# Patient Record
Sex: Male | Born: 1955 | Race: White | Hispanic: No | Marital: Married | Smoking: Former smoker
Health system: Southern US, Community
[De-identification: ages and names within clinical notes are randomized; demographics above are authoritative.]

## PROBLEM LIST (undated history)

## (undated) DIAGNOSIS — N320 Bladder-neck obstruction: Secondary | ICD-10-CM

## (undated) DIAGNOSIS — G709 Myoneural disorder, unspecified: Secondary | ICD-10-CM

## (undated) DIAGNOSIS — N138 Other obstructive and reflux uropathy: Secondary | ICD-10-CM

## (undated) DIAGNOSIS — Z973 Presence of spectacles and contact lenses: Secondary | ICD-10-CM

## (undated) DIAGNOSIS — R399 Unspecified symptoms and signs involving the genitourinary system: Secondary | ICD-10-CM

## (undated) DIAGNOSIS — D649 Anemia, unspecified: Secondary | ICD-10-CM

## (undated) DIAGNOSIS — M722 Plantar fascial fibromatosis: Secondary | ICD-10-CM

## (undated) DIAGNOSIS — E119 Type 2 diabetes mellitus without complications: Secondary | ICD-10-CM

## (undated) DIAGNOSIS — K219 Gastro-esophageal reflux disease without esophagitis: Secondary | ICD-10-CM

## (undated) DIAGNOSIS — E785 Hyperlipidemia, unspecified: Secondary | ICD-10-CM

## (undated) DIAGNOSIS — R972 Elevated prostate specific antigen [PSA]: Secondary | ICD-10-CM

## (undated) DIAGNOSIS — M7662 Achilles tendinitis, left leg: Secondary | ICD-10-CM

## (undated) DIAGNOSIS — I10 Essential (primary) hypertension: Secondary | ICD-10-CM

## (undated) DIAGNOSIS — N400 Enlarged prostate without lower urinary tract symptoms: Secondary | ICD-10-CM

## (undated) HISTORY — PX: TONSILLECTOMY: SUR1361

## (undated) HISTORY — PX: COLONOSCOPY: SHX174

## (undated) HISTORY — PX: BONE MARROW HARVEST: SHX896

## (undated) HISTORY — PX: PROSTATE BIOPSY: SHX241

## (undated) HISTORY — PX: OTHER SURGICAL HISTORY: SHX169

---

## 2013-09-29 ENCOUNTER — Other Ambulatory Visit (HOSPITAL_COMMUNITY): Payer: Self-pay | Admitting: Urology

## 2013-09-29 DIAGNOSIS — R972 Elevated prostate specific antigen [PSA]: Secondary | ICD-10-CM

## 2013-11-03 ENCOUNTER — Ambulatory Visit (HOSPITAL_COMMUNITY)
Admission: RE | Admit: 2013-11-03 | Discharge: 2013-11-03 | Disposition: A | Discharge status: Home / Self Care | Payer: PRIVATE HEALTH INSURANCE | Source: Ambulatory Visit | Attending: Urology | Admitting: Urology

## 2013-11-03 DIAGNOSIS — R9389 Abnormal findings on diagnostic imaging of other specified body structures: Secondary | ICD-10-CM | POA: Insufficient documentation

## 2013-11-03 DIAGNOSIS — N400 Enlarged prostate without lower urinary tract symptoms: Secondary | ICD-10-CM | POA: Insufficient documentation

## 2013-11-03 DIAGNOSIS — R972 Elevated prostate specific antigen [PSA]: Secondary | ICD-10-CM | POA: Insufficient documentation

## 2013-11-03 LAB — POCT I-STAT, CHEM 8
Calcium, Ion: 1.26 mmol/L — ABNORMAL HIGH (ref 1.12–1.23)
Creatinine, Ser: 1.2 mg/dL (ref 0.50–1.35)
Glucose, Bld: 126 mg/dL — ABNORMAL HIGH (ref 70–99)
Hemoglobin: 16.3 g/dL (ref 13.0–17.0)
Potassium: 4.3 mEq/L (ref 3.5–5.1)
Sodium: 139 mEq/L (ref 135–145)

## 2013-11-03 MED ORDER — GADOBENATE DIMEGLUMINE 529 MG/ML IV SOLN
20.0000 mL | Freq: Once | INTRAVENOUS | Status: AC | PRN
  Administered 2013-11-03: 20 mL via INTRAVENOUS

## 2016-04-02 DIAGNOSIS — E119 Type 2 diabetes mellitus without complications: Secondary | ICD-10-CM | POA: Insufficient documentation

## 2016-04-02 DIAGNOSIS — E785 Hyperlipidemia, unspecified: Secondary | ICD-10-CM | POA: Insufficient documentation

## 2016-04-02 DIAGNOSIS — I10 Essential (primary) hypertension: Secondary | ICD-10-CM | POA: Insufficient documentation

## 2016-04-16 ENCOUNTER — Ambulatory Visit: Payer: Self-pay

## 2016-04-16 ENCOUNTER — Ambulatory Visit (INDEPENDENT_AMBULATORY_CARE_PROVIDER_SITE_OTHER): Payer: PRIVATE HEALTH INSURANCE | Admitting: Podiatry

## 2016-04-16 ENCOUNTER — Encounter: Payer: Self-pay | Admitting: Podiatry

## 2016-04-16 VITALS — BP 137/70 | HR 82 | Resp 16

## 2016-04-16 DIAGNOSIS — M722 Plantar fascial fibromatosis: Secondary | ICD-10-CM | POA: Diagnosis not present

## 2016-04-16 DIAGNOSIS — M7662 Achilles tendinitis, left leg: Secondary | ICD-10-CM

## 2016-04-16 DIAGNOSIS — M79672 Pain in left foot: Secondary | ICD-10-CM

## 2016-04-16 MED ORDER — MELOXICAM 15 MG PO TABS: 15.0000 mg | ORAL_TABLET | Freq: Every day | ORAL | Status: DC

## 2016-04-16 NOTE — Addendum Note (Signed)
Addended by: Lottie RaterPREVETTE, ASHLEY E on: 04/16/2016 01:50 PM   Modules accepted: Orders

## 2016-04-16 NOTE — Progress Notes (Signed)
   Subjective:    Patient ID: Timothy Shepherd, male    DOB: January 11, 1956, 60 y.o.   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 He states this seems to be getting worse as of late causing him to present to his PCP for an x-ray was deemed to be negative. He states that he continues to take ibuprofen when necessary between 400-800 mg. He states that he gets some relief with this but not complete. He is a type II diabetic with a hemoglobin A1c of 7.6. He denies numbness and tingling other than occasional. He does have an area of numbness to his anterolateral right thigh.    Review of Systems  Constitutional: Positive for fatigue.  HENT: Positive for sinus pressure and sneezing.   Eyes: Positive for redness and itching.  Gastrointestinal: Positive for diarrhea.  Endocrine: Positive for polydipsia and polyuria.  Genitourinary: Positive for urgency, frequency and difficulty urinating.  Allergic/Immunologic: Positive for environmental allergies.  Neurological: Positive for numbness.  Psychiatric/Behavioral: The patient is nervous/anxious.   All other systems reviewed and are negative.      Objective:   Physical Exam: Vital signs are stable alert and oriented 3 pulses are strongly palpable. Neurologic sensorium is intact deep tendon reflexes are deferred muscle strength is normal. Orthopedic evaluation his result was distal to the ankle for range of motion without crepitation. Minimal reproducible pain on palpation at the Achilles tendon insertion site left. However there is significant pain on palpation of the left heel. This pain is palpated at the plantar fascia calcaneal insertion site which is consistent with plantar fasciitis. Cutaneous evaluation does not demonstrate any type of open wounds or lesions. Radiographs were not taken today at his request nor did he bring a disc for us to review.          Assessment & Plan:  Assessment: Probable plantar fasciitis left foot with compensatory insertional Achilles tendinitis left foot.  Plan: Discussed etiology pathology conservative versus surgical therapies. I injected his left heel today plantarly with Kenalog and local anesthetic a total of 20 mg was utilized. Placed him in a plantar fascia brace and a night splint. I also wrote a prescription for mobile. Did not write a prescription for a steroid secondary to diabetes. I will follow up with him in 1 month. We did discuss etiology pathology conservative versus surgical therapies. We also discussed appropriate shoe gear stretching exercises ice therapy and shoe modifications.

## 2016-05-19 ENCOUNTER — Ambulatory Visit (INDEPENDENT_AMBULATORY_CARE_PROVIDER_SITE_OTHER): Payer: PRIVATE HEALTH INSURANCE | Admitting: Podiatry

## 2016-05-19 ENCOUNTER — Encounter: Payer: Self-pay | Admitting: Podiatry

## 2016-05-19 DIAGNOSIS — M7662 Achilles tendinitis, left leg: Secondary | ICD-10-CM

## 2016-05-19 DIAGNOSIS — M722 Plantar fascial fibromatosis: Secondary | ICD-10-CM | POA: Diagnosis not present

## 2016-05-19 NOTE — Progress Notes (Signed)
He presents today for follow-up of his plantar fasciitis to his left heel. He states that he is approximately 80% improved. He is able to continue his anti-inflammatories his night splint and plantar fascia brace.  Objective: Vital signs are stable he is alert and oriented 3. Minimal reproduction of pain on palpation medially continue tubercle of the left heel.  Assessment: Well-healing plantar fasciitis and 80% left.  Plan: Encouraged him to continue all conservative therapies until he is 100% +1 extra month.

## 2016-06-03 ENCOUNTER — Other Ambulatory Visit: Payer: Self-pay | Admitting: Urology

## 2016-06-04 ENCOUNTER — Other Ambulatory Visit (HOSPITAL_COMMUNITY): Payer: Self-pay | Admitting: Urology

## 2016-06-04 DIAGNOSIS — R972 Elevated prostate specific antigen [PSA]: Secondary | ICD-10-CM

## 2016-06-05 ENCOUNTER — Other Ambulatory Visit: Payer: Self-pay | Admitting: Urology

## 2016-06-08 ENCOUNTER — Other Ambulatory Visit: Payer: Self-pay | Admitting: Urology

## 2016-06-09 ENCOUNTER — Ambulatory Visit (INDEPENDENT_AMBULATORY_CARE_PROVIDER_SITE_OTHER): Payer: PRIVATE HEALTH INSURANCE | Admitting: Podiatry

## 2016-06-09 ENCOUNTER — Other Ambulatory Visit: Payer: Self-pay | Admitting: Urology

## 2016-06-09 ENCOUNTER — Encounter: Payer: Self-pay | Admitting: Podiatry

## 2016-06-09 DIAGNOSIS — M722 Plantar fascial fibromatosis: Secondary | ICD-10-CM | POA: Diagnosis not present

## 2016-06-09 NOTE — Progress Notes (Signed)
He presents today for follow-up of his Achilles tendinitis and plantar fasciitis left foot. He states that he is approximately 85% improved the majority of his soreness is in the plantar aspect of the heel with apparent resolution of the Achilles tendinitis. His daughter is to be married this weekend.  Objective: Vital signs are stable alert and oriented 3. Pulses are palpable. Neurologic sensorium is intact. Deep tendon reflexes are intact. No pain on palpation to the Achilles posterior aspect of the left heel. Considerable discomfort on palpation of the medial calcaneal tubercle of the left heel.  Assessment: Plantar fasciitis left foot 85% resolved Achilles tendinitis  Plan: I reinjected his left heel with Kenalog and local anesthetic today he will continue all conservative therapies I also have him scanned for set of orthotics.

## 2016-06-12 ENCOUNTER — Other Ambulatory Visit: Payer: Self-pay | Admitting: Urology

## 2016-06-22 ENCOUNTER — Other Ambulatory Visit: Payer: Self-pay | Admitting: Urology

## 2016-07-01 ENCOUNTER — Encounter (HOSPITAL_BASED_OUTPATIENT_CLINIC_OR_DEPARTMENT_OTHER): Payer: Self-pay | Admitting: *Deleted

## 2016-07-01 NOTE — Progress Notes (Signed)
NPO AFTER MN.  ARRIVE AT [id number].  NEEDS ISTAT 8 AND EKG.  WILL TAKE UROXATRAL AM DOS W/ SIPS OF WATER.

## 2016-07-07 ENCOUNTER — Ambulatory Visit: Payer: PRIVATE HEALTH INSURANCE | Admitting: Podiatry

## 2016-07-07 ENCOUNTER — Other Ambulatory Visit: Payer: Self-pay | Admitting: Radiology

## 2016-07-08 ENCOUNTER — Encounter (HOSPITAL_BASED_OUTPATIENT_CLINIC_OR_DEPARTMENT_OTHER)
Admission: RE | Disposition: A | Discharge status: Home / Self Care | Payer: Self-pay | Source: Ambulatory Visit | Attending: Urology

## 2016-07-08 ENCOUNTER — Ambulatory Visit (HOSPITAL_BASED_OUTPATIENT_CLINIC_OR_DEPARTMENT_OTHER): Payer: PRIVATE HEALTH INSURANCE | Admitting: Anesthesiology

## 2016-07-08 ENCOUNTER — Ambulatory Visit (HOSPITAL_BASED_OUTPATIENT_CLINIC_OR_DEPARTMENT_OTHER)
Admission: RE | Admit: 2016-07-08 | Discharge: 2016-07-08 | Disposition: A | Discharge status: Home / Self Care | Payer: PRIVATE HEALTH INSURANCE | Source: Ambulatory Visit | Attending: Urology | Admitting: Urology

## 2016-07-08 ENCOUNTER — Ambulatory Visit (HOSPITAL_COMMUNITY)
Admission: RE | Admit: 2016-07-08 | Discharge: 2016-07-08 | Disposition: A | Discharge status: Home / Self Care | Payer: PRIVATE HEALTH INSURANCE | Source: Ambulatory Visit | Attending: Urology | Admitting: Urology

## 2016-07-08 ENCOUNTER — Encounter (HOSPITAL_BASED_OUTPATIENT_CLINIC_OR_DEPARTMENT_OTHER): Payer: Self-pay | Admitting: Anesthesiology

## 2016-07-08 DIAGNOSIS — E119 Type 2 diabetes mellitus without complications: Secondary | ICD-10-CM | POA: Insufficient documentation

## 2016-07-08 DIAGNOSIS — Z87891 Personal history of nicotine dependence: Secondary | ICD-10-CM | POA: Diagnosis not present

## 2016-07-08 DIAGNOSIS — Z7984 Long term (current) use of oral hypoglycemic drugs: Secondary | ICD-10-CM | POA: Insufficient documentation

## 2016-07-08 DIAGNOSIS — E785 Hyperlipidemia, unspecified: Secondary | ICD-10-CM | POA: Insufficient documentation

## 2016-07-08 DIAGNOSIS — N401 Enlarged prostate with lower urinary tract symptoms: Secondary | ICD-10-CM | POA: Diagnosis present

## 2016-07-08 DIAGNOSIS — N529 Male erectile dysfunction, unspecified: Secondary | ICD-10-CM | POA: Insufficient documentation

## 2016-07-08 DIAGNOSIS — Z7982 Long term (current) use of aspirin: Secondary | ICD-10-CM | POA: Insufficient documentation

## 2016-07-08 DIAGNOSIS — K210 Gastro-esophageal reflux disease with esophagitis: Secondary | ICD-10-CM | POA: Diagnosis not present

## 2016-07-08 DIAGNOSIS — I10 Essential (primary) hypertension: Secondary | ICD-10-CM | POA: Diagnosis not present

## 2016-07-08 DIAGNOSIS — R972 Elevated prostate specific antigen [PSA]: Secondary | ICD-10-CM

## 2016-07-08 DIAGNOSIS — Z79899 Other long term (current) drug therapy: Secondary | ICD-10-CM | POA: Insufficient documentation

## 2016-07-08 DIAGNOSIS — N138 Other obstructive and reflux uropathy: Secondary | ICD-10-CM | POA: Diagnosis not present

## 2016-07-08 DIAGNOSIS — Z791 Long term (current) use of non-steroidal anti-inflammatories (NSAID): Secondary | ICD-10-CM | POA: Insufficient documentation

## 2016-07-08 HISTORY — PX: CYSTOSCOPY WITH BIOPSY: SHX5122

## 2016-07-08 HISTORY — DX: Gastro-esophageal reflux disease without esophagitis: K21.9

## 2016-07-08 HISTORY — DX: Plantar fascial fibromatosis: M72.2

## 2016-07-08 HISTORY — DX: Achilles tendinitis, left leg: M76.62

## 2016-07-08 HISTORY — DX: Elevated prostate specific antigen (PSA): R97.20

## 2016-07-08 HISTORY — DX: Type 2 diabetes mellitus without complications: E11.9

## 2016-07-08 HISTORY — DX: Unspecified symptoms and signs involving the genitourinary system: R39.9

## 2016-07-08 HISTORY — DX: Hyperlipidemia, unspecified: E78.5

## 2016-07-08 HISTORY — DX: Essential (primary) hypertension: I10

## 2016-07-08 HISTORY — DX: Anemia, unspecified: D64.9

## 2016-07-08 HISTORY — DX: Presence of spectacles and contact lenses: Z97.3

## 2016-07-08 HISTORY — DX: Benign prostatic hyperplasia without lower urinary tract symptoms: N40.0

## 2016-07-08 HISTORY — DX: Bladder-neck obstruction: N32.0

## 2016-07-08 LAB — POCT I-STAT, CHEM 8
BUN: 13 mg/dL (ref 6–20)
CALCIUM ION: 1.3 mmol/L — AB (ref 1.12–1.23)
CREATININE: 0.8 mg/dL (ref 0.61–1.24)
Chloride: 104 mmol/L (ref 101–111)
GLUCOSE: 139 mg/dL — AB (ref 65–99)
HCT: 41 % (ref 39.0–52.0)
HEMOGLOBIN: 13.9 g/dL (ref 13.0–17.0)
Potassium: 4.1 mmol/L (ref 3.5–5.1)
Sodium: 142 mmol/L (ref 135–145)
TCO2: 23 mmol/L (ref 0–100)

## 2016-07-08 SURGERY — CYSTOSCOPY, WITH BIOPSY
Anesthesia: General | Site: Prostate

## 2016-07-08 MED ORDER — MIDAZOLAM HCL 2 MG/ML PO SYRP
10.0000 mg | ORAL_SOLUTION | Freq: Once | ORAL | Status: DC
  Filled 2016-07-08: qty 5

## 2016-07-08 MED ORDER — HYDROCODONE-ACETAMINOPHEN 7.5-325 MG PO TABS
1.0000 | ORAL_TABLET | Freq: Once | ORAL | Status: DC | PRN
  Filled 2016-07-08: qty 1

## 2016-07-08 MED ORDER — FENTANYL CITRATE (PF) 100 MCG/2ML IJ SOLN
INTRAMUSCULAR | Status: AC
  Filled 2016-07-08: qty 2

## 2016-07-08 MED ORDER — FENTANYL CITRATE (PF) 100 MCG/2ML IJ SOLN
INTRAMUSCULAR | Status: DC | PRN
  Administered 2016-07-08: 50 ug via INTRAVENOUS

## 2016-07-08 MED ORDER — PROPOFOL 10 MG/ML IV BOLUS
INTRAVENOUS | Status: AC
  Filled 2016-07-08: qty 20

## 2016-07-08 MED ORDER — GENTAMICIN SULFATE 40 MG/ML IJ SOLN
5.0000 mg/kg | INTRAVENOUS | Status: DC
  Filled 2016-07-08: qty 11

## 2016-07-08 MED ORDER — ONDANSETRON HCL 4 MG/2ML IJ SOLN
INTRAMUSCULAR | Status: DC | PRN
  Administered 2016-07-08: 4 mg via INTRAVENOUS

## 2016-07-08 MED ORDER — LIDOCAINE HCL (CARDIAC) 20 MG/ML IV SOLN
INTRAVENOUS | Status: DC | PRN
  Administered 2016-07-08: 80 mg via INTRAVENOUS

## 2016-07-08 MED ORDER — MIDAZOLAM HCL 5 MG/5ML IJ SOLN
INTRAMUSCULAR | Status: DC | PRN
  Administered 2016-07-08: 2 mg via INTRAVENOUS

## 2016-07-08 MED ORDER — HYDROCODONE-ACETAMINOPHEN 5-325 MG PO TABS
ORAL_TABLET | ORAL | Status: AC
  Filled 2016-07-08: qty 1

## 2016-07-08 MED ORDER — GENTAMICIN SULFATE 40 MG/ML IJ SOLN
5.0000 mg/kg | INTRAVENOUS | Status: AC
  Administered 2016-07-08: 440 mg via INTRAVENOUS
  Filled 2016-07-08 (×2): qty 10.75

## 2016-07-08 MED ORDER — PROPOFOL 10 MG/ML IV BOLUS
INTRAVENOUS | Status: DC | PRN
  Administered 2016-07-08: 200 mg via INTRAVENOUS

## 2016-07-08 MED ORDER — EPHEDRINE SULFATE 50 MG/ML IJ SOLN
INTRAMUSCULAR | Status: DC | PRN
  Administered 2016-07-08: 15 mg via INTRAVENOUS
  Administered 2016-07-08: 10 mg via INTRAVENOUS

## 2016-07-08 MED ORDER — STERILE WATER FOR IRRIGATION IR SOLN
Status: DC | PRN
Start: 2016-07-08 — End: 2016-07-08
  Administered 2016-07-08: 3000 mL

## 2016-07-08 MED ORDER — FENTANYL CITRATE (PF) 100 MCG/2ML IJ SOLN
25.0000 ug | INTRAMUSCULAR | Status: DC | PRN
  Filled 2016-07-08: qty 1

## 2016-07-08 MED ORDER — ONDANSETRON HCL 4 MG/2ML IJ SOLN
INTRAMUSCULAR | Status: AC
Start: 2016-07-08 — End: 2016-07-08
  Filled 2016-07-08: qty 2

## 2016-07-08 MED ORDER — DEXAMETHASONE SODIUM PHOSPHATE 10 MG/ML IJ SOLN
INTRAMUSCULAR | Status: AC
  Filled 2016-07-08: qty 1

## 2016-07-08 MED ORDER — HYDROCODONE-ACETAMINOPHEN 5-325 MG PO TABS
1.0000 | ORAL_TABLET | Freq: Four times a day (QID) | ORAL | Status: DC | PRN
  Administered 2016-07-08: 1 via ORAL
  Filled 2016-07-08: qty 2

## 2016-07-08 MED ORDER — BUPIVACAINE HCL (PF) 0.25 % IJ SOLN
INTRAMUSCULAR | Status: DC | PRN
  Administered 2016-07-08: 10 mL

## 2016-07-08 MED ORDER — HYDROCODONE-ACETAMINOPHEN 5-325 MG PO TABS: 1.0000 | ORAL_TABLET | Freq: Four times a day (QID) | ORAL | 0 refills | Status: DC | PRN

## 2016-07-08 MED ORDER — DEXAMETHASONE SODIUM PHOSPHATE 4 MG/ML IJ SOLN
INTRAMUSCULAR | Status: DC | PRN
  Administered 2016-07-08: 5 mg via INTRAVENOUS

## 2016-07-08 MED ORDER — GENTAMICIN SULFATE 40 MG/ML IJ SOLN
5.0000 mg/kg | INTRAMUSCULAR | Status: DC
  Filled 2016-07-08 (×2): qty 9.5

## 2016-07-08 MED ORDER — CIPROFLOXACIN HCL 500 MG PO TABS: 500.0000 mg | ORAL_TABLET | Freq: Two times a day (BID) | ORAL | 0 refills | Status: AC

## 2016-07-08 MED ORDER — MIDAZOLAM HCL 2 MG/2ML IJ SOLN
INTRAMUSCULAR | Status: AC
  Filled 2016-07-08: qty 2

## 2016-07-08 MED ORDER — EPHEDRINE SULFATE 50 MG/ML IJ SOLN
INTRAMUSCULAR | Status: AC
  Filled 2016-07-08: qty 1

## 2016-07-08 MED ORDER — GENTAMICIN SULFATE 40 MG/ML IJ SOLN
5.0000 mg/kg | INTRAVENOUS | Status: DC
  Filled 2016-07-08: qty 9.5

## 2016-07-08 MED ORDER — LACTATED RINGERS IV SOLN
INTRAVENOUS | Status: DC
  Administered 2016-07-08: 09:00:00 via INTRAVENOUS
  Filled 2016-07-08: qty 1000

## 2016-07-08 MED ORDER — LIDOCAINE HCL (CARDIAC) 20 MG/ML IV SOLN
INTRAVENOUS | Status: AC
  Filled 2016-07-08: qty 5

## 2016-07-08 MED ORDER — LIDOCAINE HCL 2 % EX GEL
CUTANEOUS | Status: DC | PRN
  Administered 2016-07-08: 1 via URETHRAL

## 2016-07-08 SURGICAL SUPPLY — 32 items
BAG DRAIN URO-CYSTO SKYTR STRL (DRAIN) IMPLANT
BAG URINE LEG 19OZ MD ST LTX (BAG) ×3 IMPLANT
CANISTER SUCT LVC 12 LTR MEDI- (MISCELLANEOUS) IMPLANT
CATH ROBINSON RED A/P 14FR (CATHETERS) IMPLANT
CATH ROBINSON RED A/P 16FR (CATHETERS) IMPLANT
CLOTH BEACON ORANGE TIMEOUT ST (SAFETY) ×3 IMPLANT
ELECT REM PT RETURN 9FT ADLT (ELECTROSURGICAL)
ELECTRODE REM PT RTRN 9FT ADLT (ELECTROSURGICAL) IMPLANT
GLOVE BIO SURGEON STRL SZ 6.5 (GLOVE) ×2 IMPLANT
GLOVE BIO SURGEON STRL SZ7.5 (GLOVE) ×3 IMPLANT
GLOVE BIO SURGEONS STRL SZ 6.5 (GLOVE) ×1
GLOVE BIOGEL PI IND STRL 6.5 (GLOVE) ×2 IMPLANT
GLOVE BIOGEL PI INDICATOR 6.5 (GLOVE) ×4
GOWN STRL REUS W/ TWL XL LVL3 (GOWN DISPOSABLE) IMPLANT
GOWN STRL REUS W/TWL XL LVL3 (GOWN DISPOSABLE)
INSTR BIOPSY MAXCORE 18GX20 (NEEDLE) ×3 IMPLANT
KIT ROOM TURNOVER WOR (KITS) ×3 IMPLANT
MANIFOLD NEPTUNE II (INSTRUMENTS) IMPLANT
NDL SAFETY ECLIPSE 18X1.5 (NEEDLE) IMPLANT
NEEDLE HYPO 18GX1.5 SHARP (NEEDLE)
NEEDLE HYPO 22GX1.5 SAFETY (NEEDLE) IMPLANT
NEEDLE SPNL 22GX7 QUINCKE BK (NEEDLE) ×3 IMPLANT
NS IRRIG 500ML POUR BTL (IV SOLUTION) IMPLANT
PACK CYSTO (CUSTOM PROCEDURE TRAY) ×3 IMPLANT
PACK CYSTOSCOPY (CUSTOM PROCEDURE TRAY) ×3 IMPLANT
PLUG CATH AND CAP STER (CATHETERS) ×3 IMPLANT
SYR 20CC LL (SYRINGE) IMPLANT
SYR BULB IRRIGATION 50ML (SYRINGE) IMPLANT
SYR CONTROL 10ML LL (SYRINGE) ×3 IMPLANT
TUBE CONNECTING 12'X1/4 (SUCTIONS)
TUBE CONNECTING 12X1/4 (SUCTIONS) IMPLANT
WATER STERILE IRR 3000ML UROMA (IV SOLUTION) ×6 IMPLANT

## 2016-07-08 NOTE — Discharge Instructions (Signed)
°  Post Anesthesia Home Care Instructions  Activity: Get plenty of rest for the remainder of the day. A responsible adult should stay with you for 24 hours following the procedure.  For the next 24 hours, DO NOT: -Drive a car -Advertising copywriter -Drink alcoholic beverages -Take any medication unless instructed by your physician -Make any legal decisions or sign important papers.  Meals: Start with liquid foods such as gelatin or soup. Progress to regular foods as tolerated. Avoid greasy, spicy, heavy foods. If nausea and/or vomiting occur, drink only clear liquids until the nausea and/or vomiting subsides. Call your physician if vomiting continues.  Special Instructions/Symptoms: Your throat may feel dry or sore from the anesthesia or the breathing tube placed in your throat during surgery. If this causes discomfort, gargle with warm salt water. The discomfort should disappear within 24 hours.  If you had a scopolamine patch placed behind your ear for the management of post- operative nausea and/or vomiting:  1. The medication in the patch is effective for 72 hours, after which it should be removed.  Wrap patch in a tissue and discard in the trash. Wash hands thoroughly with soap and water. 2. You may remove the patch earlier than 72 hours if you experience unpleasant side effects which may include dry mouth, dizziness or visual disturbances. 3. Avoid touching the patch. Wash your hands with soap and water after contact with the patch.   Cystoscopy patient instructions  Following a cystoscopy, a catheter (a flexible rubber tube) is sometimes left in place to empty the bladder. This may cause some discomfort or a feeling that you need to urinate. Your doctor determines the period of time that the catheter will be left in place. You may have bloody urine for two to three days (Call your doctor if the amount of bleeding increases or does not subside).  You may pass blood clots in your urine,  especially if you had a biopsy. It is not unusual to pass small blood clots and have some bloody urine a couple of weeks after your cystoscopy. Again, call your doctor if the bleeding does not subside. You may have: Dysuria (painful urination) Frequency (urinating often) Urgency (strong desire to urinate)  These symptoms are common especially if medicine is instilled into the bladder or a ureteral stent is placed. Avoiding alcohol and caffeine, such as coffee, tea, and chocolate, may help relieve these symptoms. Drink plenty of water, unless otherwise instructed. Your doctor may also prescribe an antibiotic or other medicine to reduce these symptoms.  Cystoscopy results are available soon after the procedure; biopsy results usually take two to four days. Your doctor will discuss the results of your exam with you. Before you go home, you will be given specific instructions for follow-up care. Special Instructions:  1 If you are going home with a catheter in place do not take a tub bath until removed by your doctor.  2 You may resume your normal activities.  3 Do not drive or operate machinery if you are taking narcotic pain medicine.  4 Be sure to keep all follow-up appointments with your doctor.   5 Call Your Doctor If: The catheter is not draining  You have severe pain  You are unable to urinate  You have a fever over 101  You have severe bleeding         Keep fu 07-17-2016 May restart Aspirin in 3 days

## 2016-07-08 NOTE — Interval H&P Note (Signed)
History and Physical Interval Note:  07/08/2016 9:46 AM  Timothy Course, MD  has presented today for surgery, with the diagnosis of BPH ELEVATED PSA  The various methods of treatment have been discussed with the patient and family. After consideration of risks, benefits and other options for treatment, the patient has consented to  Procedure(s): FLEXIBLE CYSTOSCOPY AND PROSTATE ULTRASOUND WITH BIOPSY (N/A) as a surgical intervention .  The patient's history has been reviewed, patient examined, no change in status, stable for surgery.  I have reviewed the patient's chart and labs.  Questions were answered to the patient's satisfaction.     [name] S

## 2016-07-08 NOTE — Anesthesia Preprocedure Evaluation (Signed)
Anesthesia Evaluation  Patient identified by MRN, date of birth, ID band Patient awake    Reviewed: Allergy & Precautions, NPO status , Patient's Chart, lab work & pertinent test results  History of Anesthesia Complications Negative for: history of anesthetic complications  Airway Mallampati: II  TM Distance: <3 FB Neck ROM: Full    Dental  (+) Teeth Intact   Pulmonary neg shortness of breath, neg sleep apnea, neg COPD, neg recent URI, former smoker,    breath sounds clear to auscultation       Cardiovascular hypertension, Pt. on medications  Rhythm:Regular     Neuro/Psych negative neurological ROS  negative psych ROS   GI/Hepatic Neg liver ROS, GERD  Controlled and Medicated,  Endo/Other  diabetes, Type 2, Oral Hypoglycemic Agents  Renal/GU negative Renal ROS     Musculoskeletal   Abdominal   Peds  Hematology negative hematology ROS (+)   Anesthesia Other Findings   Reproductive/Obstetrics                             Anesthesia Physical Anesthesia Plan  ASA: II  Anesthesia Plan: General   Post-op Pain Management:    Induction: Intravenous  Airway Management Planned: LMA and Oral ETT  Additional Equipment: None  Intra-op Plan:   Post-operative Plan: Extubation in OR  Informed Consent: I have reviewed the patients History and Physical, chart, labs and discussed the procedure including the risks, benefits and alternatives for the proposed anesthesia with the patient or authorized representative who has indicated his/her understanding and acceptance.   Dental advisory given  Plan Discussed with: CRNA and Surgeon  Anesthesia Plan Comments:         Anesthesia Quick Evaluation

## 2016-07-08 NOTE — Anesthesia Postprocedure Evaluation (Signed)
Anesthesia Post Note  Patient: Timothy Course, Timothy Shepherd  Procedure(s) Performed: Procedure(s) (LRB): FLEXIBLE CYSTOSCOPY AND PROSTATE ULTRASOUND WITH BIOPSY (N/A)  Patient location during evaluation: PACU Anesthesia Type: General Level of consciousness: awake Pain management: pain level controlled Vital Signs Assessment: post-procedure vital signs reviewed and stable Respiratory status: spontaneous breathing Cardiovascular status: stable Postop Assessment: no signs of nausea or vomiting Anesthetic complications: no    Last Vitals:  Vitals:   07/08/16 1115 07/08/16 1130  BP: 127/71 127/71  Pulse: 84 80  Resp: 12 12  Temp:      Last Pain:  Vitals:   07/08/16 1115  TempSrc:   PainSc: 0-No pain                 Kaleisha Bhargava

## 2016-07-08 NOTE — H&P (Signed)
--------------------------------------------------------------------------------   Timothy Shepherd  MRN: [id number]  PRIMARY CARE:  Timothy Shepherd, [place]  DOB: May 12, 1956, 60 year old Male  REFERRING:  Timothy Shepherd, [place]    PROVIDER:  Barron Shepherd, M.D.    LOCATION:  [facility] [phone]   --------------------------------------------------------------------------------   CC/HPI: Elevated PSA-Established Patient.  Patient presents today to undergo transrectal ultrasound-guided biopsy of the prostate along with a flexible cystoscopy to assess his outlet.    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 Addition he's had very long-standing progressive voiding symptoms. He remains on alfuzosin as well as daily Cialis and fairly maximum doses. He continues to have significant issues with obstructive and irritative voiding symptoms. Our plan is to go ahead concurrently with a cystoscopic assessment to get a better idea of the anatomy size and configuration of his outlet obstruction to give him a advised going forward after some procedural options. He is here to discuss these issues and also have additional discussion today. He does require an MRI of the prostate which showed some subtle abnormalities at the left mid and left apical region of the prostate.     ALLERGIES: Penicillins    MEDICATIONS: Adult Aspirin Low Strength 81 MG TBDP Oral  Alfuzosin HCl ER 10 MG Oral Tablet Extended Release 24 Hour 1 Oral  Fish Oil 1000 MG Oral Capsule Oral  Lipitor TABS Oral  Lisinopril 5 MG Oral Tablet Oral  Meloxicam 15 mg tablet  MetFORMIN HCl ER 500 MG Oral Tablet Extended Release 24 Hour Oral  Multi-Vitamin TABS Oral  Pepcid AC TABS Oral  Sildenafil Citrate 20 MG Oral Tablet 0 Oral     GU PSH: None     PSH Notes: Excision Of Lesion Trunk, Colonoscopy  (Fiberoptic), Tonsillectomy, Bone Marrow Collection For Transplant   NON-GU PSH: Diagnostic Colonoscopy - 2012 Remove Tonsils - 2012    GU PMH: BPH w/LUTS, Benign prostatic hyperplasia with urinary obstruction - 04/10/2016 ED, arterial insufficiency, Erectile dysfunction due to arterial insufficiency - 04/10/2016 Elevated PSA, Elevated prostate specific antigen (PSA) - 04/10/2016 Poor urinary stream, Weak urinary stream - 04/10/2016 Gross hematuria, Hematuria, gross - 02/15/2015      PMH Notes: Timothy Shepherd originally presented in December of 2012 with a combination of erectile dysfunction and some BPH symptoms. We thought he would be an excellent candidate for daily Cialis and that has worked quite well with regard to treating both problems. In addition, the patient had a PSA done as part of our routine screening. His PSA turned out to be 5.1( 12/ 2012). A free to total PSA ratio was not done at that time. Given his age, in the mid-38's, as well as a family history of prostate cancer, we felt it was quite important that we go ahead and arrange ultrasound and biopsy. Timothy Shepherd wanted to hold off on that and had some issues that he wanted to discuss with me. Of note, he did have some sexual activity prior to the PSA being drawn.    We repeated his PSA and found that it was fairly similar at 4.9 in March of 2013. In addition his PSA 2 was fairly low at 13%. Again I strongly reiterated my recommendation for ultrasound and biopsy and offered to do that with sedation. He elected to continue with observation.    In September 2013 PSA was repeated and had increased to 7.9. We  reiterated our strong recommendation to proceed with ultrasound and biopsy which he refused to schedule at that time. Repeat PSA then improved at 5.25. Free to total PSA ratio remains lower than we would like.    PSA went back up to 9.8 with a PSA 2 of 14% in September 2014 and we reiterated recommendation for biopsy.    He decided to forego  biopsy and undergo additional assessment to stratify risk. A urine PCA 3 test was done which was negative. We subsequently sent him for endorectal MRI which did not show any worrisome areas for high volume or high-grade cancer. There were some very subtle abnormalities.     NON-GU PMH: Encounter for general adult medical examination without abnormal findings, Encounter for preventive health examination - 04/10/2016 Essential (primary) hypertension, Benign Essential Hypertension - 2014 Personal history of other endocrine, nutritional and metabolic disease, History of hyperglycemia - 2014, History of diabetes mellitus, - 2014, History of hyperlipidemia, - 2014 , Chronic Reflux Esophagitis - 2012 Other elevated white blood cell count    FAMILY HISTORY: Bladder Cancer - Runs In Family Colon Cancer - Runs In Family Diabetes - Runs In Family Family Health Status - Father's Age - Father Family Health Status - Mother's Age - Mother Family Health Status Number Of Children - Mother Heart Disease - Runs In Family Hypertension - Runs In Family Kidney Cancer - Runs In Family Lung Cancer - Runs In Family Prostate Cancer - Brother   SOCIAL HISTORY: Marital Status: Married Patient does not smoke anymore.  Has not smoked since 06/01/1993.  Smoked for 20 years.  Smoked 1/2 pack per day.  Drinks 4 drinks per week. Social Drinker.  Drinks 3 caffeinated drinks per day.     Notes: Former smoker, Caffeine Use, Being A Therapist, sports, Marital History - Currently Married, Occupation:, Tobacco Use   REVIEW OF SYSTEMS:    GU Review Male:   Patient reports frequent urination, hard to postpone urination, burning/ pain with urination, get up at night to urinate, leakage of urine, stream starts and stops, trouble starting your stream, have to strain to urinate , and erection problems. Patient denies penile pain.  Gastrointestinal (Upper):   Patient denies vomiting, nausea, and indigestion/ heartburn.   Gastrointestinal (Lower):   Patient denies diarrhea and constipation.  Constitutional:   Patient reports fatigue. Patient denies fever, night sweats, and weight loss.  Skin:   Patient denies skin rash/ lesion and itching.  Eyes:   Patient denies blurred vision and double vision.  Ears/ Nose/ Throat:   Patient denies sore throat and sinus problems.  Hematologic/Lymphatic:   Patient denies swollen glands and easy bruising.  Cardiovascular:   Patient denies leg swelling and chest pains.  Respiratory:   Patient denies cough and shortness of breath.  Endocrine:   Patient denies excessive thirst.  Musculoskeletal:   Patient denies back pain and joint pain.  Neurological:   Patient denies headaches and dizziness.  Psychologic:   Patient reports anxiety. Patient denies depression.   VITAL SIGNS:    BP: 119/71 mmHg   Pulse: 82 /min   Temp: 98.2 F / 37 C      MULTI-SYSTEM PHYSICAL EXAMINATION:    Constitutional: Well-nourished. No physical deformities. Normally developed. Good grooming.  Neck: Neck symmetrical, not swollen. Normal tracheal position.  Respiratory: No labored breathing, no use of accessory muscles.   Cardiovascular: Normal temperature, normal extremity pulses, no swelling, no varicosities.  Neurologic / Psychiatric: Oriented to time, oriented to place, oriented  to person. No depression, no anxiety, no agitation.  Gastrointestinal: No mass, no tenderness, no rigidity, non obese abdomen.  Musculoskeletal: Normal gait and station of head and neck.   PAST DATA REVIEWED:  Source Of History:  Patient   PROCEDURES:          Urinalysis w/Scope - 81001 Dipstick Dipstick Cont'd Micro  Specimen: Voided Bilirubin: Neg WBC/hpf: NS (Not Seen)  Color: Yellow Ketones: Neg RBC/hpf: 0-2/hpf  Appearance: Clear Blood: Trace Lysed Bacteria: NS (Not Seen)  Specific Gravity: 1.010 Protein: Neg Cystals: NS (Not Seen)  pH: 5.5 Urobilinogen: 0.2 Casts: NS (Not Seen)  Glucose: Neg Nitrites: Neg  Trichomonas: Not Present    Leukocyte Esterase: Neg Mucous: Not Present      Epithelial Cells: 0-5/hpf      Yeast: NS (Not Seen)      Sperm: Not Present    ASSESSMENT:      ICD-10 Details  1 GU:   Elevated PSA - R97.20   2   BPH w/LUTS - N40.1    PLAN:  Proceed with transrectal ultrasound of the prostate with ultrasound-guided biopsy of flexible cystoscopy to further assess the prostate anatomy.          Schedule Return Visit: Next Available Appointment - Schedule Surgery          Document Letter(s):  Created for Patient: Clinical Summary    E & M CODE: I spent at least 25 minutes face to face with the patient, more than 50% of that time was spent on counseling and/or coordinating care.        The information contained in this medical record document is considered private and confidential patient information. This information can only be used for the medical diagnosis and/or medical services that are being provided by the patient's selected caregivers. This information can only be distributed outside of the patient's care if the patient agrees and signs waivers of authorization for this information to be sent to an outside source or route.

## 2016-07-08 NOTE — Transfer of Care (Signed)
Last Vitals:  Vitals:   07/08/16 0848  BP: 128/83  Pulse: 83  Resp: 16  Temp: 36.3 C    Last Pain:  Vitals:   07/08/16 0848  TempSrc: Oral         Immediate Anesthesia Transfer of Care Note  Patient: Philomena Course, MD  Procedure(s) Performed: Procedure(s) (LRB): FLEXIBLE CYSTOSCOPY AND PROSTATE ULTRASOUND WITH BIOPSY (N/A)  Patient Location: PACU  Anesthesia Type: General  Level of Consciousness: awake, alert  and oriented  Airway & Oxygen Therapy: Patient Spontanous Breathing and Patient connected to nasal cannula oxygen  Post-op Assessment: Report given to PACU RN and Post -op Vital signs reviewed and stable  Post vital signs: Reviewed and stable  Complications: No apparent anesthesia complications

## 2016-07-08 NOTE — Anesthesia Procedure Notes (Addendum)
Procedure Name: LMA Insertion Date/Time: 07/08/2016 10:10 AM Performed by: Norva Pavlov Pre-anesthesia Checklist: Patient identified, Emergency Drugs available, Suction available and Patient being monitored Patient Re-evaluated:Patient Re-evaluated prior to inductionOxygen Delivery Method: Circle system utilized Preoxygenation: Pre-oxygenation with 100% oxygen Intubation Type: IV induction Ventilation: Mask ventilation without difficulty LMA: LMA inserted LMA Size: 4.0 Number of attempts: 1 Airway Equipment and Method: Bite block Placement Confirmation: positive ETCO2 Tube secured with: Tape Dental Injury: Teeth and Oropharynx as per pre-operative assessment

## 2016-07-09 ENCOUNTER — Encounter (HOSPITAL_BASED_OUTPATIENT_CLINIC_OR_DEPARTMENT_OTHER): Payer: Self-pay | Admitting: Urology

## 2016-07-10 NOTE — Op Note (Signed)
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  Anesthesia: Gen.  Indications:60 year old male with very long-standing and progressive voiding symptoms.  He remains on alpha-blocker therapy as well as daily Cialis.  He has also had an elevated PSA.  PSA has continued to rise.  He has refused office ultrasound and biopsy of the prostate.  He has now agreed to prostate ultrasound with biopsy under anesthesia along with possible cystoscopy to further assess his outlet.  He also has a prior history of gross hematuria.  Previous MRI did not show any evidence of gross concern but there were some subtle abnormalities at the left mid and apical regions of the prostate.     Technique and findings:patient was brought to the operating room where successful induction of general anesthesia.  Appropriate surgical timeout was performed.  The patient did receive perioperative antibiotics. He was placed in the frog-leg position and prepped in the usual manner.  Flexible cystoscopy was then performed.anterior urethra was unremarkable.  Prostatic urethra measured 4-1/2-5 cm.  There was trilobar hyperplasiawith moderate visual obstruction.  The bladder showed some mild to moderate trabecular change.  Careful inspection of the bladder revealed no other evidence of pathology with otherwise unremarkable mucosa.A 16 French Foley catheter was placed at the end of the procedure.  The patient requested this because of concerns of postoperative urinary retention.  The patient was placed in the lateral decubitus position. The prostate size was estimated to be 2+ by digital rectal exam. The 10 MHz transrectal ultrasound probe was placed into the rectum) . The seminal vesicles appeared normal. No median lobe was present.prostate volume was estimated at  75 g.  There were no obvious hypoechoic areas.  Preliminary Appeared Intact. The biopsy cores were obtained using direct, real-time ultrasound guidance utilizing a standard 12-core pattern with one core from the right apex lateral using real time ultrasound guidance to direct the biopsy core being taken from this location, right apex medial using real time ultrasound guidance to direct the biopsy core being taken from this location, left apex lateral using real time ultrasound guidance to direct the biopsy core being taken from this location, left apex medial using real time ultrasound guidance to direct the biopsy core being taken from this location, right mid lateral using real time ultrasound guidance to direct the biopsy core being taken from this location, right mid medial using real time ultrasound guidance to direct the biopsy core being taken from this location, left mid lateral using real time ultrasound guidance to direct the biopsy core being taken from this location, left mid medial using real time ultrasound guidance to direct the biopsy core being taken from this location, right base lateral using real time ultrasound guidance to direct the biopsy core being taken from this location, right base medial using real time ultrasound guidance to direct the biopsy core being taken from this location, left base lateral using real time ultrasound guidance to direct the biopsy core being taken from this location and left base medial of the prostate using real time ultrasound guidance to direct the biopsy core being taken from this location. Extracorporal biopsies were takenIn the left mid and left apical regions. The biopsy cores were placed in buffered formalin and sent to pathlogy.  The patient was brought to PACU in stable condition having had no obvious complications or problems.

## 2016-07-14 ENCOUNTER — Ambulatory Visit: Payer: PRIVATE HEALTH INSURANCE | Admitting: Podiatry

## 2016-07-28 ENCOUNTER — Ambulatory Visit: Payer: PRIVATE HEALTH INSURANCE | Admitting: Podiatry

## 2016-08-06 ENCOUNTER — Ambulatory Visit (INDEPENDENT_AMBULATORY_CARE_PROVIDER_SITE_OTHER): Payer: PRIVATE HEALTH INSURANCE | Admitting: Podiatry

## 2016-08-06 ENCOUNTER — Encounter: Payer: Self-pay | Admitting: Podiatry

## 2016-08-06 DIAGNOSIS — M722 Plantar fascial fibromatosis: Secondary | ICD-10-CM | POA: Diagnosis not present

## 2016-08-06 NOTE — Progress Notes (Signed)
He presents today to pick up his orthotics and states that his feet or proximal 80% improved at this point.  Objective: Vital signs are stable he is alert and oriented 3. Pulses are strongly palpable. Neurologic sensorium is intact. Degenerative flexor intact. Muscle strength is normal bilateral. He has no reproducible pain.  Assessment: Plantar fascitis resolving 80%.  Plan: Dispensed orthotics today we'll follow-up with him in one month to 6 weeks if necessary. If he continues to resolve than he was follow-up with me as needed.

## 2016-08-12 ENCOUNTER — Other Ambulatory Visit: Payer: Self-pay | Admitting: Podiatry

## 2016-08-12 NOTE — Telephone Encounter (Signed)
Pt is to follow up with Dr. Al CorpusHyatt prior to future refills.

## 2016-09-01 ENCOUNTER — Ambulatory Visit (INDEPENDENT_AMBULATORY_CARE_PROVIDER_SITE_OTHER): Payer: PRIVATE HEALTH INSURANCE | Admitting: Podiatry

## 2016-09-01 DIAGNOSIS — M722 Plantar fascial fibromatosis: Secondary | ICD-10-CM

## 2016-09-01 NOTE — Progress Notes (Signed)
He presents today for follow-up of his left heel and his orthotics. He states that he his heel is much better his orthotics are doing great.  Objective: Pulses remain palpable. He has no pain on palpation medial continue tubercle of the left heel.  Assessment: Well-healing plantar fasciitis left heel.  Plan: Follow up with me on an as-needed basis.

## 2016-09-11 ENCOUNTER — Other Ambulatory Visit: Payer: Self-pay | Admitting: Podiatry

## 2017-03-30 DIAGNOSIS — N401 Enlarged prostate with lower urinary tract symptoms: Secondary | ICD-10-CM | POA: Insufficient documentation

## 2017-03-30 DIAGNOSIS — N528 Other male erectile dysfunction: Secondary | ICD-10-CM | POA: Insufficient documentation

## 2017-07-14 ENCOUNTER — Telehealth: Payer: Self-pay | Admitting: *Deleted

## 2017-07-14 NOTE — Telephone Encounter (Signed)
Request refill meloxicam. Dr. Al CorpusHyatt states pt needs to be seen prior to refill. Return fax denying.

## 2018-08-18 ENCOUNTER — Other Ambulatory Visit: Payer: Self-pay | Admitting: Urology

## 2018-08-18 DIAGNOSIS — R972 Elevated prostate specific antigen [PSA]: Secondary | ICD-10-CM

## 2018-09-01 ENCOUNTER — Ambulatory Visit
Admission: RE | Admit: 2018-09-01 | Discharge: 2018-09-01 | Disposition: A | Discharge status: Home / Self Care | Payer: Managed Care, Other (non HMO) | Source: Ambulatory Visit | Attending: Urology | Admitting: Urology

## 2018-09-01 DIAGNOSIS — R972 Elevated prostate specific antigen [PSA]: Secondary | ICD-10-CM

## 2018-09-01 MED ORDER — GADOBENATE DIMEGLUMINE 529 MG/ML IV SOLN
15.0000 mL | Freq: Once | INTRAVENOUS | Status: AC | PRN
  Administered 2018-09-01: 15 mL via INTRAVENOUS

## 2018-10-04 DIAGNOSIS — E7800 Pure hypercholesterolemia, unspecified: Secondary | ICD-10-CM | POA: Insufficient documentation

## 2018-11-23 ENCOUNTER — Other Ambulatory Visit: Payer: Self-pay | Admitting: Urology

## 2018-12-15 ENCOUNTER — Encounter (HOSPITAL_BASED_OUTPATIENT_CLINIC_OR_DEPARTMENT_OTHER): Payer: Self-pay | Admitting: *Deleted

## 2018-12-15 ENCOUNTER — Other Ambulatory Visit: Payer: Self-pay

## 2018-12-20 ENCOUNTER — Encounter (HOSPITAL_BASED_OUTPATIENT_CLINIC_OR_DEPARTMENT_OTHER): Payer: Self-pay | Admitting: *Deleted

## 2018-12-20 ENCOUNTER — Ambulatory Visit (HOSPITAL_BASED_OUTPATIENT_CLINIC_OR_DEPARTMENT_OTHER): Payer: Managed Care, Other (non HMO) | Admitting: Anesthesiology

## 2018-12-20 ENCOUNTER — Encounter (HOSPITAL_BASED_OUTPATIENT_CLINIC_OR_DEPARTMENT_OTHER)
Admission: RE | Disposition: A | Discharge status: Home / Self Care | Payer: Self-pay | Source: Ambulatory Visit | Attending: Urology

## 2018-12-20 ENCOUNTER — Other Ambulatory Visit: Payer: Self-pay

## 2018-12-20 ENCOUNTER — Ambulatory Visit (HOSPITAL_BASED_OUTPATIENT_CLINIC_OR_DEPARTMENT_OTHER)
Admission: RE | Admit: 2018-12-20 | Discharge: 2018-12-20 | Disposition: A | Discharge status: Home / Self Care | Payer: Managed Care, Other (non HMO) | Source: Ambulatory Visit | Attending: Urology | Admitting: Urology

## 2018-12-20 DIAGNOSIS — Z87891 Personal history of nicotine dependence: Secondary | ICD-10-CM | POA: Diagnosis not present

## 2018-12-20 DIAGNOSIS — N138 Other obstructive and reflux uropathy: Secondary | ICD-10-CM | POA: Diagnosis not present

## 2018-12-20 DIAGNOSIS — I10 Essential (primary) hypertension: Secondary | ICD-10-CM | POA: Diagnosis not present

## 2018-12-20 DIAGNOSIS — R3915 Urgency of urination: Secondary | ICD-10-CM | POA: Insufficient documentation

## 2018-12-20 DIAGNOSIS — K219 Gastro-esophageal reflux disease without esophagitis: Secondary | ICD-10-CM | POA: Insufficient documentation

## 2018-12-20 DIAGNOSIS — R350 Frequency of micturition: Secondary | ICD-10-CM | POA: Insufficient documentation

## 2018-12-20 DIAGNOSIS — Z7982 Long term (current) use of aspirin: Secondary | ICD-10-CM | POA: Insufficient documentation

## 2018-12-20 DIAGNOSIS — Z7984 Long term (current) use of oral hypoglycemic drugs: Secondary | ICD-10-CM | POA: Insufficient documentation

## 2018-12-20 DIAGNOSIS — R3912 Poor urinary stream: Secondary | ICD-10-CM | POA: Insufficient documentation

## 2018-12-20 DIAGNOSIS — E119 Type 2 diabetes mellitus without complications: Secondary | ICD-10-CM | POA: Insufficient documentation

## 2018-12-20 DIAGNOSIS — Z791 Long term (current) use of non-steroidal anti-inflammatories (NSAID): Secondary | ICD-10-CM | POA: Diagnosis not present

## 2018-12-20 DIAGNOSIS — E785 Hyperlipidemia, unspecified: Secondary | ICD-10-CM | POA: Insufficient documentation

## 2018-12-20 DIAGNOSIS — N401 Enlarged prostate with lower urinary tract symptoms: Secondary | ICD-10-CM | POA: Insufficient documentation

## 2018-12-20 DIAGNOSIS — Z888 Allergy status to other drugs, medicaments and biological substances status: Secondary | ICD-10-CM | POA: Insufficient documentation

## 2018-12-20 DIAGNOSIS — Z79899 Other long term (current) drug therapy: Secondary | ICD-10-CM | POA: Insufficient documentation

## 2018-12-20 HISTORY — PX: CYSTOSCOPY WITH INSERTION OF UROLIFT: SHX6678

## 2018-12-20 LAB — POCT I-STAT 4, (NA,K, GLUC, HGB,HCT)
GLUCOSE: 164 mg/dL — AB (ref 70–99)
HEMATOCRIT: 39 % (ref 39.0–52.0)
HEMOGLOBIN: 13.3 g/dL (ref 13.0–17.0)
Potassium: 4.1 mmol/L (ref 3.5–5.1)
Sodium: 140 mmol/L (ref 135–145)

## 2018-12-20 LAB — CBC
HCT: 40.5 % (ref 39.0–52.0)
Hemoglobin: 13.2 g/dL (ref 13.0–17.0)
MCH: 31.1 pg (ref 26.0–34.0)
MCHC: 32.6 g/dL (ref 30.0–36.0)
MCV: 95.3 fL (ref 80.0–100.0)
Platelets: 273 10*3/uL (ref 150–400)
RBC: 4.25 MIL/uL (ref 4.22–5.81)
RDW: 12.5 % (ref 11.5–15.5)
WBC: 12.3 10*3/uL — ABNORMAL HIGH (ref 4.0–10.5)
nRBC: 0 % (ref 0.0–0.2)

## 2018-12-20 LAB — GLUCOSE, CAPILLARY: Glucose-Capillary: 120 mg/dL — ABNORMAL HIGH (ref 70–99)

## 2018-12-20 SURGERY — CYSTOSCOPY WITH INSERTION OF UROLIFT
Anesthesia: General

## 2018-12-20 SURGERY — Surgical Case
Anesthesia: *Unknown

## 2018-12-20 MED ORDER — OXYCODONE-ACETAMINOPHEN 5-325 MG PO TABS
ORAL_TABLET | ORAL | Status: AC
  Filled 2018-12-20: qty 2

## 2018-12-20 MED ORDER — OXYCODONE-ACETAMINOPHEN 5-325 MG PO TABS
2.0000 | ORAL_TABLET | Freq: Once | ORAL | Status: AC
  Administered 2018-12-20: 2 via ORAL
  Filled 2018-12-20: qty 2

## 2018-12-20 MED ORDER — PROPOFOL 10 MG/ML IV BOLUS
INTRAVENOUS | Status: DC | PRN
  Administered 2018-12-20: 20 mg via INTRAVENOUS
  Administered 2018-12-20: 180 mg via INTRAVENOUS

## 2018-12-20 MED ORDER — NITROFURANTOIN MONOHYD MACRO 100 MG PO CAPS: 100.0000 mg | ORAL_CAPSULE | Freq: Every day | ORAL | 0 refills | Status: AC

## 2018-12-20 MED ORDER — LIDOCAINE 2% (20 MG/ML) 5 ML SYRINGE
INTRAMUSCULAR | Status: DC | PRN
  Administered 2018-12-20: 60 mg via INTRAVENOUS

## 2018-12-20 MED ORDER — OXYCODONE-ACETAMINOPHEN 5-325 MG PO TABS: 1.0000 | ORAL_TABLET | Freq: Four times a day (QID) | ORAL | 0 refills | Status: AC | PRN

## 2018-12-20 MED ORDER — LACTATED RINGERS IV SOLN
INTRAVENOUS | Status: DC
  Administered 2018-12-20: 09:00:00 via INTRAVENOUS
  Filled 2018-12-20: qty 1000

## 2018-12-20 MED ORDER — CEFAZOLIN SODIUM-DEXTROSE 2-4 GM/100ML-% IV SOLN
INTRAVENOUS | Status: AC
  Filled 2018-12-20: qty 100

## 2018-12-20 MED ORDER — PROMETHAZINE HCL 25 MG/ML IJ SOLN
6.2500 mg | INTRAMUSCULAR | Status: DC | PRN
  Filled 2018-12-20: qty 1

## 2018-12-20 MED ORDER — LIDOCAINE 2% (20 MG/ML) 5 ML SYRINGE
INTRAMUSCULAR | Status: AC
  Filled 2018-12-20: qty 5

## 2018-12-20 MED ORDER — FENTANYL CITRATE (PF) 100 MCG/2ML IJ SOLN
25.0000 ug | INTRAMUSCULAR | Status: DC | PRN
  Filled 2018-12-20: qty 1

## 2018-12-20 MED ORDER — MIDAZOLAM HCL 5 MG/5ML IJ SOLN
INTRAMUSCULAR | Status: DC | PRN
  Administered 2018-12-20: 2 mg via INTRAVENOUS

## 2018-12-20 MED ORDER — MIDAZOLAM HCL 2 MG/2ML IJ SOLN
INTRAMUSCULAR | Status: AC
  Filled 2018-12-20: qty 2

## 2018-12-20 MED ORDER — DEXAMETHASONE SODIUM PHOSPHATE 4 MG/ML IJ SOLN
INTRAMUSCULAR | Status: DC | PRN
  Administered 2018-12-20: 4 mg via INTRAVENOUS

## 2018-12-20 MED ORDER — FENTANYL CITRATE (PF) 100 MCG/2ML IJ SOLN
INTRAMUSCULAR | Status: AC
  Filled 2018-12-20: qty 2

## 2018-12-20 MED ORDER — DEXAMETHASONE SODIUM PHOSPHATE 10 MG/ML IJ SOLN
INTRAMUSCULAR | Status: AC
  Filled 2018-12-20: qty 1

## 2018-12-20 MED ORDER — CEFAZOLIN SODIUM-DEXTROSE 2-4 GM/100ML-% IV SOLN
2.0000 g | Freq: Once | INTRAVENOUS | Status: AC
  Administered 2018-12-20: 2 g via INTRAVENOUS
  Filled 2018-12-20: qty 100

## 2018-12-20 MED ORDER — ONDANSETRON HCL 4 MG/2ML IJ SOLN
INTRAMUSCULAR | Status: AC
  Filled 2018-12-20: qty 2

## 2018-12-20 MED ORDER — ONDANSETRON HCL 4 MG/2ML IJ SOLN
INTRAMUSCULAR | Status: DC | PRN
  Administered 2018-12-20: 4 mg via INTRAVENOUS

## 2018-12-20 MED ORDER — FENTANYL CITRATE (PF) 100 MCG/2ML IJ SOLN
INTRAMUSCULAR | Status: DC | PRN
  Administered 2018-12-20: 50 ug via INTRAVENOUS

## 2018-12-20 MED ORDER — STERILE WATER FOR IRRIGATION IR SOLN
Status: DC | PRN
  Administered 2018-12-20: 3000 mL

## 2018-12-20 SURGICAL SUPPLY — 24 items
BAG DRAIN URO-CYSTO SKYTR STRL (DRAIN) ×3 IMPLANT
BAG URINE DRAINAGE (UROLOGICAL SUPPLIES) IMPLANT
BAG URINE LEG 19OZ MD ST LTX (BAG) IMPLANT
BAG URINE LEG 500ML (DRAIN) IMPLANT
CATH COUDE FOLEY 2W 5CC 18FR (CATHETERS) IMPLANT
CATH FOLEY 2WAY SLVR  5CC 16FR (CATHETERS)
CATH FOLEY 2WAY SLVR  5CC 18FR (CATHETERS) ×2
CATH FOLEY 2WAY SLVR 5CC 16FR (CATHETERS) IMPLANT
CATH FOLEY 2WAY SLVR 5CC 18FR (CATHETERS) IMPLANT
CLOTH BEACON ORANGE TIMEOUT ST (SAFETY) ×3 IMPLANT
ELECT REM PT RETURN 9FT ADLT (ELECTROSURGICAL) ×3
ELECTRODE REM PT RTRN 9FT ADLT (ELECTROSURGICAL) ×1 IMPLANT
GLOVE BIO SURGEON STRL SZ7.5 (GLOVE) ×3 IMPLANT
GOWN STRL REUS W/ TWL XL LVL3 (GOWN DISPOSABLE) ×1 IMPLANT
GOWN STRL REUS W/TWL XL LVL3 (GOWN DISPOSABLE) ×2
KIT TURNOVER CYSTO (KITS) ×3 IMPLANT
MANIFOLD NEPTUNE II (INSTRUMENTS) IMPLANT
NEEDLE HYPO 22GX1.5 SAFETY (NEEDLE) IMPLANT
NS IRRIG 500ML POUR BTL (IV SOLUTION) IMPLANT
PACK CYSTO (CUSTOM PROCEDURE TRAY) ×3 IMPLANT
SYSTEM UROLIFT (Male Continence) ×8 IMPLANT
TUBE CONNECTING 12'X1/4 (SUCTIONS)
TUBE CONNECTING 12X1/4 (SUCTIONS) IMPLANT
WATER STERILE IRR 3000ML UROMA (IV SOLUTION) ×3 IMPLANT

## 2018-12-20 NOTE — Discharge Instructions (Signed)
Indwelling Urinary Catheter Care, Adult An indwelling urinary catheter is a thin tube that is put into your bladder. The tube helps to drain pee (urine) out of your body. The tube goes in through your urethra. Your urethra is where pee comes out of your body. Your pee will come out through the catheter, then it will go into a bag (drainage bag). Take good care of your catheter so it will work well. How to wear your catheter and bag Supplies needed  Sticky tape (adhesive tape) or a leg strap.  Alcohol wipe or soap and water (if you use tape).  A clean towel (if you use tape).  Large overnight bag.  Smaller bag (leg bag). Wearing your catheter Attach your catheter to your leg with tape or a leg strap.  Make sure the catheter is not pulled tight.  If a leg strap gets wet, take it off and put on a dry strap.  If you use tape to hold the bag on your leg: 1. Use an alcohol wipe or soap and water to wash your skin where the tape made it sticky before. 2. Use a clean towel to pat-dry that skin. 3. Use new tape to make the bag stay on your leg. Wearing your bags You should have been given a large overnight bag.  You may wear the overnight bag in the day or night.  Always have the overnight bag lower than your bladder.  Do not let the bag touch the floor.  Before you go to sleep, put a clean plastic bag in a wastebasket. Then hang the overnight bag inside the wastebasket. You should also have a smaller leg bag that fits under your clothes.  Always wear the leg bag below your knee.  Do not wear your leg bag at night. How to care for your skin and catheter Supplies needed  A clean washcloth.  Water and mild soap.  A clean towel. Caring for your skin and catheter      Clean the skin around your catheter every day: ? Wash your hands with soap and water. ? Wet a clean washcloth in warm water and mild soap. ? Clean the skin around your urethra. ? If you are male: ? Gently  spread the folds of skin around your vagina (labia). ? With the washcloth in your other hand, wipe the inner side of your labia on each side. Wipe from front to back. ? If you are male: ? Pull back any skin that covers the end of your penis (foreskin). ? With the washcloth in your other hand, wipe your penis in small circles. Start wiping at the tip of your penis, then move away from the catheter. ? With your free hand, hold the catheter close to where it goes into your body. ? Keep holding the catheter during cleaning so it does not get pulled out. ? With the washcloth in your other hand, clean the catheter. ? Only wipe downward on the catheter. ? Do not wipe upward toward your body. Doing this may push germs into your urethra and cause infection. ? Use a clean towel to pat-dry the catheter and the skin around it. Make sure to wipe off all soap. ? Wash your hands with soap and water.  Shower every day. Do not take baths.  Do not use cream, ointment, or lotion on the area where the catheter goes into your body, unless your doctor tells you to.  Do not use powders, sprays, or lotions  on your genital area.  Check your skin around the catheter every day for signs of infection. Check for: ? Redness, swelling, or pain. ? Fluid or blood. ? Warmth. ? Pus or a bad smell. How to empty the bag Supplies needed  Rubbing alcohol.  Gauze pad or cotton ball.  Tape or a leg strap. Emptying the bag Pour the pee out of your bag when it is ?- full, or at least 2-3 times a day. Do this for your overnight bag and your leg bag. 1. Wash your hands with soap and water. 2. Separate (detach) the bag from your leg. 3. Hold the bag over the toilet or a clean pail. Keep the bag lower than your hips and bladder. This is so the pee (urine) does not go back into the tube. 4. Open the pour spout. It is at the bottom of the bag. 5. Empty the pee into the toilet or pail. Do not let the pour spout touch any  surface. 6. Put rubbing alcohol on a gauze pad or cotton ball. 7. Use the gauze pad or cotton ball to clean the pour spout. 8. Close the pour spout. 9. Attach the bag to your leg with tape or a leg strap. 10. Wash your hands with soap and water. Follow instructions for cleaning the drainage bag:  From the product maker.  As told by your doctor. How to change the bag Supplies needed  Alcohol wipes.  A clean bag.  Tape or a leg strap. Changing the bag Replace your bag with a clean bag once a month. If it starts to leak, smell bad, or look dirty, change it sooner. 1. Wash your hands with soap and water. 2. Separate the dirty bag from your leg. 3. Pinch the catheter with your fingers so that pee does not spill out. 4. Separate the catheter tube from the bag tube where these tubes connect (at the connection valve). Do not let the tubes touch any surface. 5. Clean the end of the catheter tube with an alcohol wipe. Use a different alcohol wipe to clean the end of the bag tube. 6. Connect the catheter tube to the tube of the clean bag. 7. Attach the clean bag to your leg with tape or a leg strap. Do not make the bag tight on your leg. 8. Wash your hands with soap and water. General rules   Never pull on your catheter. Never try to take it out. Doing that can hurt you.  Always wash your hands before and after you touch your catheter or bag. Use a mild, fragrance-free soap. If you do not have soap and water, use hand sanitizer.  Always make sure there are no twists or bends (kinks) in the catheter tube.  Always make sure there are no leaks in the catheter or bag.  Drink enough fluid to keep your pee pale yellow.  Do not take baths, swim, or use a hot tub.  If you are male, wipe from front to back after you poop (have a bowel movement). Contact a doctor if:  Your pee is cloudy.  Your pee smells worse than usual.  Your catheter gets clogged.  Your catheter leaks.  Your  bladder feels full. Get help right away if:  You have redness, swelling, or pain where the catheter goes into your body.  You have fluid, blood, pus, or a bad smell coming from the area where the catheter goes into your body.  Your skin feels warm  where the catheter goes into your body.  You have a fever.  You have pain in your: ? Belly (abdomen). ? Legs. ? Lower back. ? Bladder.  You see blood in the catheter.  Your pee is pink or red.  You feel sick to your stomach (nauseous).  You throw up (vomit).  You have chills.  Your pee is not draining into the bag.  Your catheter gets pulled out. Summary  An indwelling urinary catheter is a thin tube that is placed into the bladder to help drain pee (urine) out of the body.  The catheter is placed into the part of the body that drains pee from the bladder (urethra).  Taking good care of your catheter will keep it working properly and help prevent problems.  Always wash your hands before and after touching your catheter or bag.  Never pull on your catheter or try to take it out. This information is not intended to replace advice given to you by your health care provider. Make sure you discuss any questions you have with your health care provider. Document Released: 03/27/2013 Document Revised: 05/23/2018 Document Reviewed: 07/16/2017 Elsevier Interactive Patient Education  2019 [employer]   Prostatic Urethral Lift, Care After This sheet gives you information about how to care for yourself after your procedure. Your health care provider may also give you more specific instructions. If you have problems or questions, contact your health care provider. What can I expect after the procedure? After the procedure, it is common to have:  Discomfort or burning when urinating.  An increased urge to urinate.  More frequent urination.  Urine that is slightly blood-tinged. These symptoms should go away after a few  days. Follow these instructions at home:   Take over-the-counter and prescription medicines only as told by your health care provider.  Do not drive for 24 hours if you were given a medicine to help you relax (sedative).  Do not drive or use heavy machinery while taking prescription pain medicine.  Do not lift anything that is heavier than 10 lb (4.5 kg) until your health care provider says that this is safe.  Return to your normal activities as told by your health care provider. Ask your health care provider what activities are safe for you. Ask when you can return to sexual activity.  Drink enough fluid to keep your urine clear or pale yellow.  Keep all follow-up visits as told by your health care provider. This is important. Contact a health care provider if:  You have chills or a fever.  You have pain when passing urine.  You have bright red blood or blood clots in your urine.  You have difficulty passing urine.  You have leaking of urine (incontinence). Get help right away if:  You have chest pain or shortness of breath.  You have leg pain or swelling.  You cannot pass urine. Summary  After the procedure, it is common to have discomfort or burning when urinating, an increased urge to urinate, more frequent urination, and urine that is slightly blood-tinged.  Do not drive for 24 hours if you were given a medicine to help you relax (sedative). Do not drive or use heavy machinery while taking prescription pain medicine.  Do not lift anything that is heavier than 10 lb (4.5 kg) until your health care provider says that this is safe.  Return to your normal activities as told by your health care provider. This information is not intended to replace  advice given to you by your health care provider. Make sure you discuss any questions you have with your health care provider. Document Released: 01/19/2017 Document Revised: 01/19/2017 Document Reviewed: 01/19/2017 Elsevier  Interactive Patient Education  2019 [employer].     Post Anesthesia Home Care Instructions  Activity: Get plenty of rest for the remainder of the day. A responsible individual must stay with you for 24 hours following the procedure.  For the next 24 hours, DO NOT: -Drive a car -Advertising copywriter -Drink alcoholic beverages -Take any medication unless instructed by your physician -Make any legal decisions or sign important papers.  Meals: Start with liquid foods such as gelatin or soup. Progress to regular foods as tolerated. Avoid greasy, spicy, heavy foods. If nausea and/or vomiting occur, drink only clear liquids until the nausea and/or vomiting subsides. Call your physician if vomiting continues.  Special Instructions/Symptoms: Your throat may feel dry or sore from the anesthesia or the breathing tube placed in your throat during surgery. If this causes discomfort, gargle with warm salt water. The discomfort should disappear within 24 hours.  If you had a scopolamine patch placed behind your ear for the management of post- operative nausea and/or vomiting:  1. The medication in the patch is effective for 72 hours, after which it should be removed.  Wrap patch in a tissue and discard in the trash. Wash hands thoroughly with soap and water. 2. You may remove the patch earlier than 72 hours if you experience unpleasant side effects which may include dry mouth, dizziness or visual disturbances. 3. Avoid touching the patch. Wash your hands with soap and water after contact with the patch.

## 2018-12-20 NOTE — Op Note (Signed)
Preoperative diagnosis: BPH with obstructive symptomatology. Postoperative diagnosis: Same  Principal procedure: Urolift procedure, with the placement of 4 implants.  Surgeon: Mena Goes  Anesthesia: Gen  Complications: None  Drains: 18 French Foley catheter, to leg bag.  Estimated blood loss: Less than 25 mL  Indications: 63 -year-old male with obstructive symptomatology secondary to BPH. The patient's symptoms have progressed, and he has requested further management. Management options including TURP with resection/ablation of the prostate as well as Urolift were discussed. The patient has chosen to have a Urolift procedure. He has been instructed to the procedure as well as risks and complications which include but are not limited to infection, bleeding, and inadequate treatment with the Urolift procedure alone, anesthetic complications, among others. He understands these and desires to proceed.  Findings: On exam under anesthesia.  The penis was circumcised and without mass or lesion.,  The testicles were descended bilaterally and palpably normal, on digital rectal exam the prostate was 60 to 80 g, smooth without hard area or nodule.  All landmarks preserved. Using the 17 French cystoscope, urethra and bladder were inspected. There were no urethral lesions. Prostatic urethra was obstructed secondary to bilobar hypertrophy -there were some small stones in the prostatic urethra clinging to the mucosa with some reactive changes around them. The bladder was inspected circumferentially. This revealed normal findings.  There was moderate trabeculation.  No stone or foreign body in the bladder.  Description of procedure: The patient was properly identified in the holding area. He received preoperative antibiotics. He was taken to the procedure room where monitored anesthesia care was administered. He was placed in the dorsolithotomy position. Genitalia and perineum were prepped and draped. Proper  timeout was performed.  I did an exam under anesthesia.  Gloves were changed. A 56F cystoscope was inserted into the bladder. The cystoscopy bridge was replaced with a UroLift delivery device.The first treatment site was the patient's left side approximately 1.5cm distal to the bladder neck. The distal tip of the delivery device was then angled laterally approximately 20 degrees at this position to compress the lateral lobe. The trigger was pulled, thereby deploying a needle containing the implant through the prostate. The needle was then retracted, allowing one end of the implant to be delivered to the capsular surface of the prostate. The implant was then tensioned to assure capsular seating and removal of slack monofilament. The device was then angled back toward midline and slowly advanced proximally until cystoscopic verification of the monofilament being centered in the delivery bay. The urethral end piece was then affixed to the monofilament thereby tailoring the size of the implant. Excess filament was then severed. The delivery device was then re-advanced into the bladder. The delivery device was then replaced with cystoscope and bridge and the implant location and opening effect was confirmed cystoscopically. The same procedure was then repeated on the right side, and 2 additional implants were delivered just proximal to the verumontanum, again one on right and one on left side of the prostate, following the same technique. A final cystoscopy was conducted first to inspect the location and state of each implant and second, to confirm the presence of a continuous anterior channel was present through the prostatic urethra with irrigation flow turned off. 4 Implants were delivered in total.  Following this, the scope was removed and an 18 [place] Foley catheter was placed and hooked to dependent drainage. He was then awakened and taken to the recovery area in stable condition. He tolerated the procedure  well.

## 2018-12-20 NOTE — Transfer of Care (Signed)
Immediate Anesthesia Transfer of Care Note  Patient: Timothy Course, MD  Procedure(s) Performed: CYSTOSCOPY WITH INSERTION OF UROLIFT (N/A )  Patient Location: PACU  Anesthesia Type:General  Level of Consciousness: sedated  Airway & Oxygen Therapy: Patient Spontanous Breathing and Patient connected to nasal cannula oxygen  Post-op Assessment: Report given to RN and Post -op Vital signs reviewed and stable  Post vital signs: Reviewed and stable  Last Vitals:  Vitals Value Taken Time  BP 117/81 12/20/2018 10:23 AM  Temp    Pulse 69 12/20/2018 10:26 AM  Resp 10 12/20/2018 10:26 AM  SpO2 100 % 12/20/2018 10:26 AM  Vitals shown include unvalidated device data.  Last Pain:  Vitals:   12/20/18 0830  TempSrc:   PainSc: 0-No pain      Patients Stated Pain Goal: 4 (12/20/18 0830)  Complications: No apparent anesthesia complications

## 2018-12-20 NOTE — Anesthesia Postprocedure Evaluation (Signed)
Anesthesia Post Note  Patient: Timothy Course, MD  Procedure(s) Performed: CYSTOSCOPY WITH INSERTION OF UROLIFT (N/A )     Patient location during evaluation: PACU Anesthesia Type: General Level of consciousness: awake Pain management: pain level controlled Vital Signs Assessment: post-procedure vital signs reviewed and stable Respiratory status: spontaneous breathing Cardiovascular status: stable Postop Assessment: no apparent nausea or vomiting Anesthetic complications: no    Last Vitals:  Vitals:   12/20/18 1130 12/20/18 1137  BP: (!) 96/57 121/76  Pulse: 73 76  Resp: 10 13  Temp:    SpO2: 97% 98%    Last Pain:  Vitals:   12/20/18 1145  TempSrc:   PainSc: 3    Pain Goal: Patients Stated Pain Goal: 4 (12/20/18 1130)               Caren Macadam

## 2018-12-20 NOTE — Anesthesia Procedure Notes (Signed)
Procedure Name: LMA Insertion Date/Time: 12/20/2018 9:49 AM Performed by: Caren Macadam, CRNA Pre-anesthesia Checklist: Patient identified, Emergency Drugs available, Suction available and Patient being monitored Patient Re-evaluated:Patient Re-evaluated prior to induction Oxygen Delivery Method: Circle system utilized Preoxygenation: Pre-oxygenation with 100% oxygen Induction Type: IV induction Ventilation: Mask ventilation without difficulty LMA: LMA inserted LMA Size: 4.0 Number of attempts: 1 Placement Confirmation: positive ETCO2 and breath sounds checked- equal and bilateral Tube secured with: Tape Dental Injury: Teeth and Oropharynx as per pre-operative assessment

## 2018-12-20 NOTE — H&P (Addendum)
H&P  Chief Complaint: BPH with lower urinary tract symptoms  History of Present Illness: 63 year old male with a history of BPH and weak stream frequency and urgency.  He has been on alfuzosin and finasteride for many years.  Also daily tadalafil.  Cystoscopy revealed lateral lobe hypertrophy and he presents today for UroLift, prostatic urethral lift, insertion.  He has a history of PSA elevation up to 8.4 with negative prior biopsy and negative MRI September 2019 revealing a 56 g prostate.  He has been well without fever, dysuria, congestion or any other issues.  No gross hematuria.  Past Medical History:  Diagnosis Date  . Anemia    6 MONTHS AGO  . BNC (bladder neck contracture)   . BPH (benign prostatic hyperplasia)   . Elevated PSA   . GERD (gastroesophageal reflux disease)   . Hyperlipidemia   . Hypertension   . Lower urinary tract symptoms (LUTS)    3- 70YRS AGO  . Plantar fasciitis of left foot    BOTH FEET MOSTLY LEFT  . Tendonitis, Achilles, left   . Type 2 diabetes mellitus (HCC)   . Wears glasses    Past Surgical History:  Procedure Laterality Date  . BONE MARROW HARVEST  1990's   donation to daughter  . COLONOSCOPY  2007 approx AND ONE SINCE  . CYSTOSCOPY     IN OFFICE  . CYSTOSCOPY WITH BIOPSY N/A 07/08/2016   Procedure: FLEXIBLE CYSTOSCOPY AND PROSTATE ULTRASOUND WITH BIOPSY;  Surgeon: Barron Alvineavid Grapey, MD;  [facility];  Service: Urology;  Laterality: N/A;  bladder  . PROSTATE BIOPSY    . TONSILLECTOMY  as child    Home Medications:  Medications Prior to Admission  Medication Sig Dispense Refill Last Dose  . alfuzosin (UROXATRAL) 10 MG 24 hr tablet Take 10 mg by mouth 2 (two) times daily.  11 12/19/2018 at Unknown time  . aspirin EC 81 MG tablet Take 81 mg by mouth daily.   Past Week at Unknown time  . atorvastatin (LIPITOR) 10 MG tablet Take 10 mg by mouth every evening.    12/20/2018 at 0500  . cetirizine (ZYRTEC) 10 MG tablet Take 10 mg by  mouth daily.   12/20/2018 at 0500  . famotidine (TH FAMOTIDINE 10) 10 MG tablet Take 10 mg by mouth as needed.    Past Week at Unknown time  . finasteride (PROSCAR) 5 MG tablet Take 5 mg by mouth daily.   12/20/2018 at 0500  . KRILL OIL PO Take by mouth. OMEGA RED 4 IN ONE KRILL AND OMEGA FISH OIL   Past Month at 1800  . lisinopril (PRINIVIL,ZESTRIL) 5 MG tablet Take 5 mg by mouth every morning.    12/19/2018 at Unknown time  . meloxicam (MOBIC) 15 MG tablet TAKE 1 TABLET (15 MG TOTAL) BY MOUTH DAILY. 30 tablet 10 Past Week at Unknown time  . metFORMIN (GLUCOPHAGE-XR) 500 MG 24 hr tablet Take 500 mg by mouth 2 (two) times daily. 2 500 MG TABS BID   12/19/2018 at Unknown time  . Multiple Vitamin (MULTIVITAMIN) capsule Take by mouth.   Past Month at Unknown time  . oxymetazoline (AFRIN) 0.05 % nasal spray Place 1 spray into both nostrils 2 (two) times daily as needed.    12/19/2018 at Unknown time  . sildenafil (VIAGRA) 100 MG tablet Take 100 mg by mouth as needed.    12/19/2018 at Unknown time  . tadalafil (CIALIS) 5 MG tablet Take 5 mg by mouth  daily.    12/20/2018 at 0500  . Testosterone 40.5 MG/2.5GM (1.62%) GEL Place 3 applicators onto the skin. 3 PUMPS ONCE A DAY   Past Week at Unknown time   Allergies:  Allergies  Allergen Reactions  . Adhesive [Tape]     LOCAL RASH  . Nickel     LOCALIZED RASH  . Penicillins Hives    RASH    History reviewed. No pertinent family history. Social History:  reports that he quit smoking about 25 years ago. His smoking use included cigarettes. He has a 15.00 pack-year smoking history. He has never used smokeless tobacco. He reports current alcohol use. He reports that he does not use drugs.  ROS: A complete review of systems was performed.  All systems are negative except for pertinent findings as noted. Review of Systems  All other systems reviewed and are negative.    Physical Exam:  Vital signs in last 24 hours: Temp:  [97.8 F (36.6 C)] 97.8 F (36.6 C)  (01/07 0742) Pulse Rate:  [74] 74 (01/07 0742) Resp:  [16] 16 (01/07 0742) BP: (121)/(78) 121/78 (01/07 0742) SpO2:  [98 %] 98 % (01/07 0742) Weight:  [85.3 kg] 85.3 kg (01/07 0742) General:  Alert and oriented, No acute distress HEENT: Normocephalic, atraumatic Cardiovascular: Regular rate and rhythm Lungs: Regular rate and effort Abdomen: Soft, nontender, nondistended, no abdominal masses Back: No CVA tenderness Extremities: No edema Neurologic: Grossly intact  Laboratory Data:  Results for orders placed or performed during the hospital encounter of 12/20/18 (from the past 24 hour(s))  CBC     Status: Abnormal   Collection Time: 12/20/18  8:50 AM  Result Value Ref Range   WBC 12.3 (H) 4.0 - 10.5 K/uL   RBC 4.25 4.22 - 5.81 MIL/uL   Hemoglobin 13.2 13.0 - 17.0 g/dL   HCT 15.1 76.1 - 60.7 %   MCV 95.3 80.0 - 100.0 fL   MCH 31.1 26.0 - 34.0 pg   MCHC 32.6 30.0 - 36.0 g/dL   RDW 37.1 06.2 - 69.4 %   Platelets 273 150 - 400 K/uL   nRBC 0.0 0.0 - 0.2 %  I-STAT 4, (NA,K, GLUC, HGB,HCT)     Status: Abnormal   Collection Time: 12/20/18  9:00 AM  Result Value Ref Range   Sodium 140 135 - 145 mmol/L   Potassium 4.1 3.5 - 5.1 mmol/L   Glucose, Bld 164 (H) 70 - 99 mg/dL   HCT 85.4 62.7 - 03.5 %   Hemoglobin 13.3 13.0 - 17.0 g/dL   No results found for this or any previous visit (from the past 240 hour(s)). Creatinine: No results for input(s): CREATININE in the last 168 hours.  Impression/Assessment:  BPH with lower urinary tract symptoms-  Plan:  I discussed with the patient and his wife the nature, potential benefits, risks and alternatives to prostatic urethral lift, including side effects of the proposed treatment, the likelihood of the patient achieving the goals of the procedure, and any potential problems that might occur during the procedure or recuperation. All questions answered.  We discussed postop care and Foley.  Patient elects to proceed.  I wanted to add he had a  remote history of a rash with penicillin in his teens or 5s.  We talked about the cross reaction with cefazolin and other antibiotics such as fluoroquinolones.  We will proceed with cefazolin.   Jerilee Field 12/20/2018, 9:40 AM

## 2018-12-20 NOTE — Anesthesia Preprocedure Evaluation (Signed)
Anesthesia Evaluation  Patient identified by MRN, date of birth, ID band Patient awake    Reviewed: Allergy & Precautions, NPO status , Patient's Chart, lab work & pertinent test results  History of Anesthesia Complications Negative for: history of anesthetic complications  Airway Mallampati: II  TM Distance: <3 FB Neck ROM: Full    Dental  (+) Teeth Intact, Dental Advisory Given   Pulmonary neg shortness of breath, neg sleep apnea, neg COPD, neg recent URI, former smoker,    breath sounds clear to auscultation       Cardiovascular hypertension, Pt. on medications  Rhythm:Regular     Neuro/Psych negative neurological ROS  negative psych ROS   GI/Hepatic Neg liver ROS, GERD  Controlled and Medicated,  Endo/Other  diabetes, Type 2, Oral Hypoglycemic Agents  Renal/GU negative Renal ROS     Musculoskeletal   Abdominal   Peds  Hematology negative hematology ROS (+)   Anesthesia Other Findings   Reproductive/Obstetrics                             Anesthesia Physical  Anesthesia Plan  ASA: II  Anesthesia Plan: General   Post-op Pain Management:    Induction: Intravenous  PONV Risk Score and Plan: 3 and Ondansetron, Diphenhydramine, Treatment may vary due to age or medical condition and Scopolamine patch - Pre-op  Airway Management Planned: LMA  Additional Equipment: None  Intra-op Plan:   Post-operative Plan: Extubation in OR  Informed Consent: I have reviewed the patients History and Physical, chart, labs and discussed the procedure including the risks, benefits and alternatives for the proposed anesthesia with the patient or authorized representative who has indicated his/her understanding and acceptance.   Dental advisory given  Plan Discussed with: CRNA and Surgeon  Anesthesia Plan Comments:         Anesthesia Quick Evaluation

## 2018-12-21 ENCOUNTER — Encounter (HOSPITAL_BASED_OUTPATIENT_CLINIC_OR_DEPARTMENT_OTHER): Payer: Self-pay | Admitting: Urology

## 2020-01-21 ENCOUNTER — Ambulatory Visit: Payer: Self-pay

## 2020-02-08 ENCOUNTER — Ambulatory Visit: Payer: Managed Care, Other (non HMO)

## 2021-12-25 ENCOUNTER — Other Ambulatory Visit: Payer: Self-pay | Admitting: Urology

## 2021-12-25 DIAGNOSIS — R972 Elevated prostate specific antigen [PSA]: Secondary | ICD-10-CM

## 2022-03-17 ENCOUNTER — Ambulatory Visit (HOSPITAL_COMMUNITY): Admission: RE | Admit: 2022-03-17 | Payer: Medicare Other | Source: Ambulatory Visit

## 2022-03-17 ENCOUNTER — Encounter (HOSPITAL_COMMUNITY): Payer: Self-pay

## 2022-04-09 ENCOUNTER — Ambulatory Visit (HOSPITAL_COMMUNITY)
Admission: RE | Admit: 2022-04-09 | Discharge: 2022-04-09 | Disposition: A | Discharge status: Home / Self Care | Payer: Medicare Other | Source: Ambulatory Visit | Attending: Urology | Admitting: Urology

## 2022-04-09 DIAGNOSIS — R972 Elevated prostate specific antigen [PSA]: Secondary | ICD-10-CM | POA: Insufficient documentation

## 2022-04-09 MED ORDER — GADOBUTROL 1 MMOL/ML IV SOLN
8.0000 mL | Freq: Once | INTRAVENOUS | Status: AC | PRN
  Administered 2022-04-09: 8 mL via INTRAVENOUS

## 2023-04-29 ENCOUNTER — Ambulatory Visit (INDEPENDENT_AMBULATORY_CARE_PROVIDER_SITE_OTHER): Payer: Medicare Other

## 2023-04-29 ENCOUNTER — Ambulatory Visit (INDEPENDENT_AMBULATORY_CARE_PROVIDER_SITE_OTHER): Payer: Medicare Other | Admitting: Podiatry

## 2023-04-29 ENCOUNTER — Encounter: Payer: Self-pay | Admitting: Podiatry

## 2023-04-29 DIAGNOSIS — Z1211 Encounter for screening for malignant neoplasm of colon: Secondary | ICD-10-CM | POA: Insufficient documentation

## 2023-04-29 DIAGNOSIS — M722 Plantar fascial fibromatosis: Secondary | ICD-10-CM

## 2023-04-29 DIAGNOSIS — G5782 Other specified mononeuropathies of left lower limb: Secondary | ICD-10-CM | POA: Diagnosis not present

## 2023-04-29 DIAGNOSIS — R195 Other fecal abnormalities: Secondary | ICD-10-CM | POA: Insufficient documentation

## 2023-04-29 DIAGNOSIS — M778 Other enthesopathies, not elsewhere classified: Secondary | ICD-10-CM

## 2023-04-29 MED ORDER — TRIAMCINOLONE ACETONIDE 40 MG/ML IJ SUSP
20.0000 mg | Freq: Once | INTRAMUSCULAR | Status: AC
Start: 2023-04-29 — End: 2023-04-29
  Administered 2023-04-29: 20 mg

## 2023-04-30 NOTE — Progress Notes (Signed)
Subjective:  Patient ID: Timothy Course, MD, male    DOB: Mar 29, 1956,  MRN: [id number] HPI Chief Complaint  Patient presents with   Foot Pain    Flare of PF left - may need new orthotics   New Patient (Initial Visit)    Est pt 2017    67 y.o. male presents with the above complaint.   ROS: Denies fever chills nausea vomit muscle aches pains calf pain back pain chest pain shortness of breath.  Past Medical History:  Diagnosis Date   Anemia    6 MONTHS AGO   BNC (bladder neck contracture)    BPH (benign prostatic hyperplasia)    Elevated PSA    GERD (gastroesophageal reflux disease)    Hyperlipidemia    Hypertension    Lower urinary tract symptoms (LUTS)    3- 77YRS AGO   Plantar fasciitis of left foot    BOTH FEET MOSTLY LEFT   Tendonitis, Achilles, left    Type 2 diabetes mellitus (HCC)    Wears glasses    Past Surgical History:  Procedure Laterality Date   BONE MARROW HARVEST  1990's   donation to daughter   COLONOSCOPY  2007 approx AND ONE SINCE   CYSTOSCOPY     IN OFFICE   CYSTOSCOPY WITH BIOPSY N/A 07/08/2016   Procedure: FLEXIBLE CYSTOSCOPY AND PROSTATE ULTRASOUND WITH BIOPSY;  Surgeon: Barron Alvine, MD;  Location: [facility];  Service: Urology;  Laterality: N/A;  bladder   CYSTOSCOPY WITH INSERTION OF UROLIFT N/A 12/20/2018   Procedure: CYSTOSCOPY WITH INSERTION OF UROLIFT;  Surgeon: Jerilee Field, MD;  [facility]: [facility];  Service: Urology;  Laterality: N/A;   PROSTATE BIOPSY     TONSILLECTOMY  as child    Current Outpatient Medications:    aspirin EC 81 MG tablet, Take 81 mg by mouth daily., Disp: , Rfl:    atorvastatin (LIPITOR) 10 MG tablet, Take 10 mg by mouth every evening. , Disp: , Rfl:    cetirizine (ZYRTEC) 10 MG tablet, Take 10 mg by mouth daily., Disp: , Rfl:    famotidine (TH FAMOTIDINE 10) 10 MG tablet, Take 10 mg by mouth as needed. , Disp: , Rfl:    finasteride (PROSCAR) 5 MG tablet, Take 5 mg by mouth  daily., Disp: , Rfl:    lisinopril (PRINIVIL,ZESTRIL) 5 MG tablet, Take 5 mg by mouth every morning. , Disp: , Rfl:    meloxicam (MOBIC) 15 MG tablet, TAKE 1 TABLET (15 MG TOTAL) BY MOUTH DAILY., Disp: 30 tablet, Rfl: 10   metFORMIN (GLUCOPHAGE-XR) 500 MG 24 hr tablet, Take 500 mg by mouth 2 (two) times daily. 2 500 MG TABS BID, Disp: , Rfl:    Multiple Vitamin (MULTIVITAMIN) capsule, Take by mouth., Disp: , Rfl:    oxymetazoline (AFRIN) 0.05 % nasal spray, Place 1 spray into both nostrils 2 (two) times daily as needed. , Disp: , Rfl:    OZEMPIC, 1 MG/DOSE, 4 MG/3ML SOPN, Inject 1 mg into the skin once a week., Disp: , Rfl:    sildenafil (VIAGRA) 100 MG tablet, Take 100 mg by mouth as needed. , Disp: , Rfl:    tadalafil (CIALIS) 5 MG tablet, Take 5 mg by mouth daily. , Disp: , Rfl:   Allergies  Allergen Reactions   Adhesive [Tape]     LOCAL RASH   Nickel     LOCALIZED RASH   Penicillins Hives    RASH   Review of Systems Objective:  There were no vitals filed for this visit.  General: Well developed, nourished, in no acute distress, alert and oriented x3   Dermatological: Skin is warm, dry and supple bilateral. Nails x 10 are well maintained; remaining integument appears unremarkable at this time. There are no open sores, no preulcerative lesions, no rash or signs of infection present.  Vascular: Dorsalis Pedis artery and Posterior Tibial artery pedal pulses are 2/4 bilateral with immedate capillary fill time. Pedal hair growth present. No varicosities and no lower extremity edema present bilateral.   Neruologic: Grossly intact via light touch bilateral. Vibratory intact via tuning fork bilateral. Protective threshold with Semmes Wienstein monofilament intact to all pedal sites bilateral. Patellar and Achilles deep tendon reflexes 2+ bilateral. No Babinski or clonus noted bilateral.   Musculoskeletal: No gross boney pedal deformities bilateral. No pain, crepitus, or limitation noted  with foot and ankle range of motion bilateral. Muscular strength 5/5 in all groups tested bilateral.  Palpable Mulder's click to the third interdigital space of the right foot.  Pain on palpation medial calcaneal tubercle of the left foot.  Gait: Unassisted, Nonantalgic.    Radiographs:  Radiographs of the left foot demonstrate osseously mature individual small plantar and posterior distally and proximally oriented calcaneal heel close noted with soft tissue increase in density at the plantar fascial calcaneal insertion site.  Also demonstrates some mild metatarsus adductus as well as digital adductus.  No significant osteoarthritic changes noted.  Assessment & Plan:   Assessment: Planter fasciitis.  Neuroma third interdigital space right.  Plan: Discussed etiology pathology conservative surgical therapies. Also scanned him for a new set of orthotics.  We also injected the right foot at the third interdigital space.     Timothy Shepherd T. [place], [place]

## 2023-05-27 ENCOUNTER — Ambulatory Visit (INDEPENDENT_AMBULATORY_CARE_PROVIDER_SITE_OTHER): Payer: Medicare Other | Admitting: Podiatry

## 2023-05-27 DIAGNOSIS — M722 Plantar fascial fibromatosis: Secondary | ICD-10-CM

## 2023-05-27 DIAGNOSIS — G5782 Other specified mononeuropathies of left lower limb: Secondary | ICD-10-CM

## 2023-05-27 NOTE — Progress Notes (Signed)
Patient presents today to pick up custom orthotics  Patient was dispensed 1 pair of custom orthotics. Fit was satisfactory. Instructions for break-in and wear was reviewed and a copy was given to the patient.

## 2023-08-18 ENCOUNTER — Ambulatory Visit: Payer: 59 | Admitting: Urology

## 2023-08-23 ENCOUNTER — Ambulatory Visit (INDEPENDENT_AMBULATORY_CARE_PROVIDER_SITE_OTHER): Payer: Medicare Other | Admitting: Urology

## 2023-08-23 ENCOUNTER — Encounter: Payer: Self-pay | Admitting: Urology

## 2023-08-23 VITALS — BP 109/70 | HR 80 | Ht 71.0 in | Wt 186.5 lb

## 2023-08-23 DIAGNOSIS — N401 Enlarged prostate with lower urinary tract symptoms: Secondary | ICD-10-CM | POA: Diagnosis not present

## 2023-08-23 DIAGNOSIS — R3911 Hesitancy of micturition: Secondary | ICD-10-CM

## 2023-08-23 NOTE — Progress Notes (Signed)
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 Mena Goes and Anne Fu, NP at [facility] urology and was referred for consideration of HOLEP.  He has a long history of BPH as well as elevated PSA, negative prostate biopsy, negative prostate MRI(03/2022 prostate volume 99g, no suspicious lesions.  He also has a history of UroLift in 2020 that initially improved his symptoms.  His primary urinary complaints are weak stream, intermittency, pelvic fullness and a sensation of incomplete emptying.  PVR was reportedly in 2022, improved slightly to after starting finasteride.  PSA decreased appropriately on finasteride to 10 from 20 prior.  He has had about 3 infections previously, no history of retention.  Had bothersome side effects from alpha blockers in the past.  He is very hesitant to consider surgical options.  Cystoscopy in 2022 showed obstructive appearing prostate tissue, no other abnormalities.  PVR remains significantly elevated today at .   PMH: Past Medical History:  Diagnosis Date   Anemia    6 MONTHS AGO   BNC (bladder neck contracture)    BPH (benign prostatic hyperplasia)    Elevated PSA    GERD (gastroesophageal reflux disease)    Hyperlipidemia    Hypertension    Lower urinary tract symptoms (LUTS)    3- 77YRS AGO   Plantar fasciitis of left foot    BOTH FEET MOSTLY LEFT   Tendonitis, Achilles, left    Type 2 diabetes mellitus (HCC)    Wears glasses     Surgical History: Past Surgical History:  Procedure Laterality Date   BONE MARROW HARVEST  1990's   donation to daughter   COLONOSCOPY  2007 approx AND ONE SINCE   CYSTOSCOPY     IN OFFICE   CYSTOSCOPY WITH BIOPSY N/A 07/08/2016   Procedure: FLEXIBLE CYSTOSCOPY AND PROSTATE ULTRASOUND WITH BIOPSY;  Surgeon: Barron Alvine, MD;  Location: [facility];  Service: Urology;  Laterality: N/A;  bladder   CYSTOSCOPY WITH INSERTION OF UROLIFT N/A 12/20/2018   Procedure: CYSTOSCOPY WITH INSERTION OF UROLIFT;  Surgeon: Jerilee Field, MD;  Location[facility];  Service: Urology;  Laterality: N/A;   PROSTATE BIOPSY     TONSILLECTOMY  as child    Social History:  reports that he quit smoking about 29 years ago. His smoking use included cigarettes. He started smoking about 49 years ago. He has a 15 pack-year smoking history. He has never used smokeless tobacco. He reports current alcohol use. He reports that he does not use drugs.  Physical Exam: BP 109/70   Pulse 80   Ht 5\' 11"  (1.803 m)   Wt 186 lb 8 oz (84.6 kg)   BMI 26.01 kg/m    Constitutional:  Alert and oriented, No acute distress. Cardiovascular: No clubbing, cyanosis, or edema. Respiratory: Normal respiratory effort, no increased work of breathing. GI: Abdomen is soft, nontender, nondistended, no abdominal masses  Laboratory Data: Reviewed, see HPI  Pertinent Imaging: I have personally viewed and interpreted the prostate MRI from 2023 showing a 99g prostate with no suspicious lesions.  Assessment & Plan:   67 year old male with long history of BPH and obstructive urinary symptoms, UroLift in 2020 with initial symptom improvement but persistently worsening symptoms over the last few years including multiple PVRs >815ml.  PSA decreased appropriately on finasteride, history of negative prostate biopsy and prostate MRI April  2023 with no suspicious lesions and prostate measured 99g.  We discussed options to improve his urinary symptoms and his emptying, as well as the risks of deferring treatment including recurrent UTIs, retention urine, renal damage/failure, and potentially permanent bladder damage.  We reviewed for large prostates HOLEP is an excellent option, other alternative options could include prostatic artery embolization or aqua ablation.  Risk and  benefits of these procedures was discussed.  We discussed the risks and benefits of HoLEP at length.  The procedure requires general anesthesia and takes 1 to 2 hours, and a holmium laser is used to enucleate the prostate and push this tissue into the bladder.  A morcellator is then used to remove this tissue, which is sent for pathology.  The vast majority(>95%) of patients are able to discharge the same day with a catheter in place for 2 to 3 days, and will follow-up in clinic for a voiding trial.  We specifically discussed the risks of bleeding, infection, retrograde ejaculation, temporary urgency and urge incontinence, very low risk of long-term incontinence, urethral stricture/bladder neck contracture, pathologic evaluation of prostate tissue and possible detection of prostate cancer or other malignancy, and possible need for additional procedures.  He will call if he opts to schedule HOLEP  Legrand Rams, MD 08/23/2023  [facility] [address], [address] [place], [place] [address] [phone]

## 2023-08-23 NOTE — Patient Instructions (Addendum)

## 2023-10-25 ENCOUNTER — Telehealth: Payer: Self-pay | Admitting: Podiatry

## 2023-10-25 NOTE — Telephone Encounter (Signed)
Pts wife called stating she called last week needing to get a invoice for the orthotics in may. They are trying to get reimbursement for them from the insurance company. They have the stated from [employer] just needing a more detained invoice emailed to the pt. The email is correct in his chart.

## 2023-10-25 NOTE — Telephone Encounter (Signed)
Notified pt invoice was emailed to pt

## 2024-02-21 ENCOUNTER — Telehealth: Payer: Self-pay | Admitting: Urology

## 2024-02-21 NOTE — Telephone Encounter (Signed)
 Please advise if patient needs to be seen prior to scheduling.

## 2024-02-21 NOTE — Telephone Encounter (Signed)
 Patient called to let Dr. Richardo Hanks know that he is ready to proceed with HoLep.

## 2024-02-22 ENCOUNTER — Other Ambulatory Visit: Payer: Self-pay | Admitting: Urology

## 2024-02-22 ENCOUNTER — Telehealth: Payer: Self-pay

## 2024-02-22 ENCOUNTER — Other Ambulatory Visit: Payer: Self-pay

## 2024-02-22 DIAGNOSIS — N401 Enlarged prostate with lower urinary tract symptoms: Secondary | ICD-10-CM

## 2024-02-22 DIAGNOSIS — N138 Other obstructive and reflux uropathy: Secondary | ICD-10-CM

## 2024-02-22 NOTE — Progress Notes (Signed)
   [facility] Surgical Posting Form  Surgery Date: Date: 03/24/2024  Surgeon: Dr. Legrand Rams, MD  Inpt ( No  )   Outpt (Yes)   Obs ( No  )   Diagnosis: N40.1, N13.8 Benign Prostatic Hyperplasia with Urinary Obstruction  -CPT: 62130  Surgery: Holmium Laser Enucleation of the Prostate   Stop Anticoagulations: Yes and hold ASA  Patient advised to hold Ozempic for 1 week prior to surgery  Cardiac/Medical/Pulmonary Clearance needed: no  *Orders entered into EPIC  Date: 02/22/24   *Case booked in EPIC  Date: 02/22/24  *Notified pt of Surgery: Date: 02/22/24  PRE-OP UA & CX: yes, will obtain in clinic on 03/10/2024  *Placed into Prior Authorization Work Angela Nevin Date: 02/22/24  Assistant/laser/rep:No

## 2024-02-22 NOTE — Telephone Encounter (Signed)
  Per Dr. Richardo Hanks, Patient is to be scheduled for Holmium Laser Enucleation of the Prostate   Timothy Shepherd was contacted and possible surgical dates were discussed, Friday April 11th, 2025 was agreed upon for surgery.   Patient was instructed that Dr. Richardo Hanks will require them to provide a pre-op UA & CX prior to surgery. This was ordered and scheduled drop off appointment was made for 03/10/2024.    Patient was directed to call [phone] between 1-3pm the day before surgery to find out surgical arrival time.  Instructions were given not to eat or drink from midnight on the night before surgery and have a driver for the day of surgery. On the surgery day patient was instructed to enter through the [facility] entrance of [facility] report the Same Day Surgery desk.   Pre-Admit Testing will be in contact via phone to set up an interview with the anesthesia team to review your history and medications prior to surgery.   Reminder of this information was sent via Mail to the patient.

## 2024-02-22 NOTE — Telephone Encounter (Signed)
 Called pt offered appt, he declines at this time.

## 2024-02-22 NOTE — Progress Notes (Signed)
 Surgical Physician Order Form [facility]   Dr. Legrand Rams, MD  * Scheduling expectation : Next Available  *Length of Case: 2 hours  *Clearance needed: no  *Anticoagulation Instructions: Hold all anticoagulants  *Aspirin Instructions: Hold Aspirin  *Post-op visit Date/Instructions:  1-3 day cath removal, 20-month MD PVR  *Diagnosis: BPH w/urinary obstruction  *Procedure:  HOLEP (40981)   Additional orders: N/A  -Admit type: OUTpatient  -Anesthesia: General  -VTE Prophylaxis Standing Order SCD's       Other:   -Standing Lab Orders Per Anesthesia    Lab other: UA&Urine Culture  -Standing Test orders EKG/Chest x-ray per Anesthesia       Test other:   - Medications:  Ancef 2gm IV  -Other orders:  N/A

## 2024-03-10 ENCOUNTER — Other Ambulatory Visit

## 2024-03-10 DIAGNOSIS — N138 Other obstructive and reflux uropathy: Secondary | ICD-10-CM

## 2024-03-10 LAB — MICROSCOPIC EXAMINATION: Bacteria, UA: NONE SEEN

## 2024-03-10 LAB — URINALYSIS, COMPLETE
Bilirubin, UA: NEGATIVE
Glucose, UA: NEGATIVE
Ketones, UA: NEGATIVE
Leukocytes,UA: NEGATIVE
Nitrite, UA: NEGATIVE
Protein,UA: NEGATIVE
RBC, UA: NEGATIVE
Specific Gravity, UA: 1.02 (ref 1.005–1.030)
Urobilinogen, Ur: 0.2 mg/dL (ref 0.2–1.0)
pH, UA: 6 (ref 5.0–7.5)

## 2024-03-13 LAB — CULTURE, URINE COMPREHENSIVE

## 2024-03-16 ENCOUNTER — Encounter
Admission: RE | Admit: 2024-03-16 | Discharge: 2024-03-16 | Disposition: A | Discharge status: Home / Self Care | Source: Ambulatory Visit | Attending: Urology | Admitting: Urology

## 2024-03-16 ENCOUNTER — Encounter: Payer: Self-pay | Admitting: Urology

## 2024-03-16 ENCOUNTER — Other Ambulatory Visit: Payer: Self-pay

## 2024-03-16 VITALS — Ht 71.0 in | Wt 185.0 lb

## 2024-03-16 DIAGNOSIS — E119 Type 2 diabetes mellitus without complications: Secondary | ICD-10-CM

## 2024-03-16 DIAGNOSIS — D649 Anemia, unspecified: Secondary | ICD-10-CM

## 2024-03-16 DIAGNOSIS — I10 Essential (primary) hypertension: Secondary | ICD-10-CM

## 2024-03-16 DIAGNOSIS — Z01812 Encounter for preprocedural laboratory examination: Secondary | ICD-10-CM

## 2024-03-16 HISTORY — DX: Myoneural disorder, unspecified: G70.9

## 2024-03-16 NOTE — Patient Instructions (Addendum)
 Your procedure is scheduled on: 03/24/2024  Friday  Report to the Registration Desk on the 1st floor of the [facility].  To find out your arrival time, please call [phone] between 1PM - 3PM on: 03/23/2024 Thursday If your arrival time is 6:00 am, do not arrive before that time as the [facility] entrance doors do not open until 6:00 am.  REMEMBER: Instructions that are not followed completely may result in serious medical risk, up to and including death; or upon the discretion of your surgeon and anesthesiologist your surgery may need to be rescheduled.  Do not eat food after midnight the night before surgery.  No gum chewing or hard candies.   One week prior to surgery: Stop Anti-inflammatories (NSAIDS) such as Advil, Aleve, Ibuprofen, Motrin, Naproxen, Naprosyn and Aspirin based products such as Excedrin, Goody's Powder, BC Powder. Stop ANY OVER THE COUNTER supplements until after surgery like multivitamin  Hold Ozempic 7 days prior to surgery.   Hold sildenafil (VIAGRA) and  tadalafil (CIALIS) two days prior to surgery  Follow the instructions of Aspirin given by your Physician.  You may however, continue to take Tylenol if needed for pain up until the day of surgery.   Continue taking all of your other prescription medications up until the day of surgery.  ON THE DAY OF SURGERY ONLY TAKE THESE MEDICATIONS WITH SIPS OF WATER:  atorvastatin (LIPITOR)  famotidine (PEPCID) 3.   finasteride (PROSCAR)  4.   No Alcohol for 24 hours before or after surgery.  No Smoking including e-cigarettes for 24 hours before surgery.  No chewable tobacco products for at least 6 hours before surgery.  No nicotine patches on the day of surgery.  Do not use any "recreational" drugs for at least a week (preferably 2 weeks) before your surgery.  Please be advised that the combination of cocaine and anesthesia may have negative outcomes, up to and including death. If you test positive for  cocaine, your surgery will be cancelled.  On the morning of surgery brush your teeth with toothpaste and water, you may rinse your mouth with mouthwash if you wish. Do not swallow any toothpaste or mouthwash.  Please shower on day of surgery.  Do not wear jewelry, make-up, hairpins, clips or nail polish.  For welded (permanent) jewelry: bracelets, anklets, waist bands, etc.  Please have this removed prior to surgery.  If it is not removed, there is a chance that hospital personnel will need to cut it off on the day of surgery.  Do not wear lotions, powders, or perfumes.   Do not shave body hair from the neck down 48 hours before surgery.  Contact lenses, hearing aids and dentures may not be worn into surgery.  Do not bring valuables to the hospital. [facility] is not responsible for any missing/lost belongings or valuables.    Notify your doctor if there is any change in your medical condition (cold, fever, infection).  Wear comfortable clothing (specific to your surgery type) to the hospital.  After surgery, you can help prevent lung complications by doing breathing exercises.  Take deep breaths and cough every 1-2 hours. Your doctor may order a device called an Incentive Spirometer to help you take deep breaths.  If you are being discharged the day of surgery, you will not be allowed to drive home. You will need a responsible individual to drive you home and stay with you for 24 hours after surgery.    Please call the Pre-admissions  Testing Dept. at [phone] if you have any questions about these instructions.  Surgery Visitation Policy:  Patients having surgery or a procedure may have two visitors.  Children under the age of 89 must have an adult with them who is not the patient.

## 2024-03-20 NOTE — Progress Notes (Signed)
  Perioperative Services Pre-Admission/Anesthesia Testing    Date: 03/20/24  Name: Timothy Kirker, MD MRN:   [id number]  Re: GLP-1 clearance and provider recommendations   Planned Surgical Procedure(s):    Case: [id number] Date/Time: 03/24/24 0904   Procedure: ENUCLEATION, PROSTATE, USING LASER, WITH MORCELLATION   Anesthesia type: General   Diagnosis: Benign prostatic hyperplasia with urinary obstruction [N40.1, N13.8]   Pre-op diagnosis: Benign Prostatic Hyperplasia with Urinary Obstruction   Location: [facility] OR ROOM 10 / [facility]   Surgeons: Sondra Come, MD      Clinical Notes:  Patient is scheduled for the above procedure with the indicated provider/surgeon. In review of his medication reconciliation it was noted that patient is on a prescribed GLP-1 medication. Per guidelines issued by the American Society of Anesthesiologists (ASA), it is recommended that these medications be held for 7 days prior to the patient undergoing any type of elective surgical procedure. The patient is taking the following GLP-1 medication:  [x]  SEMAGLUTIDE   []  EXENATIDE  []  LIRAGLUTIDE   []  LIXISENATIDE  []  DULAGLUTIDE     []  TIRZEPATIDE (GLP-1/GIP)  Reached out to prescribing provider Arlyce Dice, PA-C) to make them aware of the guidelines from anesthesia. Given that this patient takes the prescribed GLP-1 medication for his  diabetes diagnosis, rather than for weight loss, recommendations from the prescribing provider were solicited. Prescribing provider made aware of the following so that informed decision/POC can be developed for this patient that may be taking medications belonging to these drug classes:  Oral GLP-1 medications will be held 1 day prior to surgery.  Injectable GLP-1 medications will be held 7 days prior to surgery.  Metformin is routinely held 48 hours prior to surgery due to renal concerns, potential need for contrasted imaging perioperatively, and the  potential for tissue hypoxia leading to drug induced lactic acidosis.  All SGLT2i medications are held 72 hours prior to surgery as they can be associated with the increased potential for developing euglycemic diabetic ketoacidosis (EDKA).   Impression and Plan:  Philomena Course, MD is on a prescribed GLP-1 medication, which induces the known side effect of decreased gastric emptying. Efforts are bring made to mitigate the risk of perioperative hyperglycemic events, as elevated blood glucose levels have been found to contribute to intra/postoperative complications. Additionally, hyperglycemic extremes can potentially necessitate the postponing of a patient's elective case in order to better optimize perioperative glycemic control, again with the aforementioned guidelines in place. With this in mind, recommendations have been sought from the prescribing provider, who has cleared patient to proceed with holding the prescribed GLP-1 as per the guidelines from the ASA.   Provider recommending: no further recommendations received from the prescribing provider.  Copy of signed clearance and recommendations placed on patient's chart for inclusion in their medical record and for review by the surgical/anesthetic team on the day of his procedure.   Quentin Mulling, MSN, APRN, FNP-C, CEN [facility]  Perioperative Services Nurse Practitioner Phone: [phone] Fax: [phone] 03/20/24 2:25 PM  NOTE: This note has been prepared using Dragon dictation software. Despite my best ability to proofread, there is always the potential that unintentional transcriptional errors may still occur from this process.

## 2024-03-21 ENCOUNTER — Encounter: Payer: Self-pay | Admitting: Urgent Care

## 2024-03-21 ENCOUNTER — Encounter
Admission: RE | Admit: 2024-03-21 | Discharge: 2024-03-21 | Disposition: A | Discharge status: Home / Self Care | Source: Ambulatory Visit | Attending: Urology | Admitting: Urology

## 2024-03-21 DIAGNOSIS — I10 Essential (primary) hypertension: Secondary | ICD-10-CM | POA: Insufficient documentation

## 2024-03-21 DIAGNOSIS — Z01818 Encounter for other preprocedural examination: Secondary | ICD-10-CM | POA: Diagnosis present

## 2024-03-21 DIAGNOSIS — D649 Anemia, unspecified: Secondary | ICD-10-CM | POA: Diagnosis not present

## 2024-03-21 DIAGNOSIS — Z01810 Encounter for preprocedural cardiovascular examination: Secondary | ICD-10-CM | POA: Diagnosis not present

## 2024-03-21 DIAGNOSIS — Z01812 Encounter for preprocedural laboratory examination: Secondary | ICD-10-CM

## 2024-03-21 DIAGNOSIS — E119 Type 2 diabetes mellitus without complications: Secondary | ICD-10-CM | POA: Insufficient documentation

## 2024-03-21 LAB — BASIC METABOLIC PANEL WITH GFR
Anion gap: 9 (ref 5–15)
BUN: 19 mg/dL (ref 8–23)
CO2: 25 mmol/L (ref 22–32)
Calcium: 9.7 mg/dL (ref 8.9–10.3)
Chloride: 106 mmol/L (ref 98–111)
Creatinine, Ser: 0.97 mg/dL (ref 0.61–1.24)
GFR, Estimated: >60 mL/min (ref >60)
Glucose, Bld: 125 mg/dL — ABNORMAL HIGH (ref 70–99)
Potassium: 4.3 mmol/L (ref 3.5–5.1)
Sodium: 140 mmol/L (ref 135–145)

## 2024-03-21 LAB — CBC
HCT: 38.8 % — ABNORMAL LOW (ref 39.0–52.0)
Hemoglobin: 13.1 g/dL (ref 13.0–17.0)
MCH: 31.3 pg (ref 26.0–34.0)
MCHC: 33.8 g/dL (ref 30.0–36.0)
MCV: 92.8 fL (ref 80.0–100.0)
Platelets: 296 10*3/uL (ref 150–400)
RBC: 4.18 MIL/uL — ABNORMAL LOW (ref 4.22–5.81)
RDW: 12.8 % (ref 11.5–15.5)
WBC: 10.7 10*3/uL — ABNORMAL HIGH (ref 4.0–10.5)
nRBC: 0 % (ref 0.0–0.2)

## 2024-03-23 MED ORDER — ORAL CARE MOUTH RINSE: 15.0000 mL | Freq: Once | OROMUCOSAL | Status: AC

## 2024-03-23 MED ORDER — CHLORHEXIDINE GLUCONATE 0.12 % MT SOLN
15.0000 mL | Freq: Once | OROMUCOSAL | Status: AC
  Administered 2024-03-24: 15 mL via OROMUCOSAL

## 2024-03-23 MED ORDER — CEFAZOLIN SODIUM-DEXTROSE 2-4 GM/100ML-% IV SOLN: 2.0000 g | INTRAVENOUS | Status: DC

## 2024-03-24 ENCOUNTER — Encounter
Admission: RE | Disposition: A | Discharge status: Home / Self Care | Payer: Self-pay | Source: Ambulatory Visit | Attending: Urology

## 2024-03-24 ENCOUNTER — Ambulatory Visit
Admission: RE | Admit: 2024-03-24 | Discharge: 2024-03-24 | Disposition: A | Discharge status: Home / Self Care | Source: Ambulatory Visit | Attending: Urology | Admitting: Urology

## 2024-03-24 ENCOUNTER — Other Ambulatory Visit: Payer: Self-pay

## 2024-03-24 ENCOUNTER — Encounter: Payer: Self-pay | Admitting: Urology

## 2024-03-24 ENCOUNTER — Ambulatory Visit: Payer: Self-pay | Admitting: Urgent Care

## 2024-03-24 DIAGNOSIS — N401 Enlarged prostate with lower urinary tract symptoms: Secondary | ICD-10-CM | POA: Insufficient documentation

## 2024-03-24 DIAGNOSIS — Z87440 Personal history of urinary (tract) infections: Secondary | ICD-10-CM | POA: Diagnosis not present

## 2024-03-24 DIAGNOSIS — E119 Type 2 diabetes mellitus without complications: Secondary | ICD-10-CM | POA: Diagnosis not present

## 2024-03-24 DIAGNOSIS — Z01812 Encounter for preprocedural laboratory examination: Secondary | ICD-10-CM

## 2024-03-24 DIAGNOSIS — Z87891 Personal history of nicotine dependence: Secondary | ICD-10-CM | POA: Insufficient documentation

## 2024-03-24 DIAGNOSIS — I10 Essential (primary) hypertension: Secondary | ICD-10-CM | POA: Diagnosis not present

## 2024-03-24 DIAGNOSIS — R350 Frequency of micturition: Secondary | ICD-10-CM | POA: Insufficient documentation

## 2024-03-24 DIAGNOSIS — R3912 Poor urinary stream: Secondary | ICD-10-CM | POA: Diagnosis not present

## 2024-03-24 DIAGNOSIS — Z7985 Long-term (current) use of injectable non-insulin antidiabetic drugs: Secondary | ICD-10-CM | POA: Diagnosis not present

## 2024-03-24 DIAGNOSIS — N138 Other obstructive and reflux uropathy: Secondary | ICD-10-CM | POA: Insufficient documentation

## 2024-03-24 HISTORY — PX: HOLEP-LASER ENUCLEATION OF THE PROSTATE WITH MORCELLATION: SHX6641

## 2024-03-24 HISTORY — DX: Other obstructive and reflux uropathy: N13.8

## 2024-03-24 LAB — GLUCOSE, CAPILLARY
Glucose-Capillary: 134 mg/dL — ABNORMAL HIGH (ref 70–99)
Glucose-Capillary: 119 mg/dL — ABNORMAL HIGH (ref 70–99)

## 2024-03-24 SURGERY — ENUCLEATION, PROSTATE, USING LASER, WITH MORCELLATION
Anesthesia: General | Site: Prostate

## 2024-03-24 MED ORDER — SODIUM CHLORIDE 0.9 % IV SOLN: INTRAVENOUS | Status: DC

## 2024-03-24 MED ORDER — FENTANYL CITRATE (PF) 100 MCG/2ML IJ SOLN
INTRAMUSCULAR | Status: AC
  Filled 2024-03-24: qty 2

## 2024-03-24 MED ORDER — LACTATED RINGERS IV SOLN: INTRAVENOUS | Status: DC | PRN

## 2024-03-24 MED ORDER — EPHEDRINE 5 MG/ML INJ
INTRAVENOUS | Status: AC
Start: 2024-03-24 — End: ?
  Filled 2024-03-24: qty 5

## 2024-03-24 MED ORDER — ONDANSETRON HCL 4 MG/2ML IJ SOLN
INTRAMUSCULAR | Status: AC
  Filled 2024-03-24: qty 2

## 2024-03-24 MED ORDER — LIDOCAINE HCL (PF) 2 % IJ SOLN
INTRAMUSCULAR | Status: AC
  Filled 2024-03-24: qty 5

## 2024-03-24 MED ORDER — ROCURONIUM BROMIDE 100 MG/10ML IV SOLN
INTRAVENOUS | Status: DC | PRN
  Administered 2024-03-24: 40 mg via INTRAVENOUS

## 2024-03-24 MED ORDER — LIDOCAINE HCL (CARDIAC) PF 100 MG/5ML IV SOSY
PREFILLED_SYRINGE | INTRAVENOUS | Status: DC | PRN
  Administered 2024-03-24: 100 mg via INTRAVENOUS

## 2024-03-24 MED ORDER — ONDANSETRON HCL 4 MG/2ML IJ SOLN
INTRAMUSCULAR | Status: DC | PRN
  Administered 2024-03-24: 4 mg via INTRAVENOUS

## 2024-03-24 MED ORDER — PROPOFOL 10 MG/ML IV BOLUS
INTRAVENOUS | Status: DC | PRN
  Administered 2024-03-24: 50 mg via INTRAVENOUS
  Administered 2024-03-24: 150 mg via INTRAVENOUS

## 2024-03-24 MED ORDER — EPHEDRINE SULFATE-NACL 50-0.9 MG/10ML-% IV SOSY
PREFILLED_SYRINGE | INTRAVENOUS | Status: DC | PRN
  Administered 2024-03-24: 5 mg via INTRAVENOUS

## 2024-03-24 MED ORDER — FENTANYL CITRATE (PF) 100 MCG/2ML IJ SOLN
INTRAMUSCULAR | Status: DC | PRN
  Administered 2024-03-24 (×2): 50 ug via INTRAVENOUS

## 2024-03-24 MED ORDER — SODIUM CHLORIDE 0.9 % IR SOLN
Status: DC | PRN
  Administered 2024-03-24: 15000 mL
  Administered 2024-03-24: 6000 mL

## 2024-03-24 MED ORDER — PHENYLEPHRINE 80 MCG/ML (10ML) SYRINGE FOR IV PUSH (FOR BLOOD PRESSURE SUPPORT)
PREFILLED_SYRINGE | INTRAVENOUS | Status: DC | PRN
  Administered 2024-03-24 (×2): 40 ug via INTRAVENOUS
  Administered 2024-03-24: 80 ug via INTRAVENOUS

## 2024-03-24 MED ORDER — PROPOFOL 10 MG/ML IV BOLUS
INTRAVENOUS | Status: AC
  Filled 2024-03-24: qty 20

## 2024-03-24 MED ORDER — PHENYLEPHRINE 80 MCG/ML (10ML) SYRINGE FOR IV PUSH (FOR BLOOD PRESSURE SUPPORT)
PREFILLED_SYRINGE | INTRAVENOUS | Status: AC
Start: 2024-03-24 — End: ?
  Filled 2024-03-24: qty 10

## 2024-03-24 MED ORDER — ROCURONIUM BROMIDE 10 MG/ML (PF) SYRINGE
PREFILLED_SYRINGE | INTRAVENOUS | Status: AC
  Filled 2024-03-24: qty 10

## 2024-03-24 MED ORDER — CEFAZOLIN SODIUM-DEXTROSE 2-4 GM/100ML-% IV SOLN
INTRAVENOUS | Status: AC
  Filled 2024-03-24: qty 100

## 2024-03-24 MED ORDER — CHLORHEXIDINE GLUCONATE 0.12 % MT SOLN
OROMUCOSAL | Status: AC
  Filled 2024-03-24: qty 15

## 2024-03-24 MED ORDER — TRAMADOL HCL 50 MG PO TABS: 25.0000 mg | ORAL_TABLET | Freq: Four times a day (QID) | ORAL | 0 refills | Status: AC | PRN

## 2024-03-24 MED ORDER — DEXAMETHASONE SODIUM PHOSPHATE 10 MG/ML IJ SOLN
INTRAMUSCULAR | Status: DC | PRN
  Administered 2024-03-24: 4 mg via INTRAVENOUS

## 2024-03-24 MED ORDER — MIDAZOLAM HCL 2 MG/2ML IJ SOLN
INTRAMUSCULAR | Status: AC
  Filled 2024-03-24: qty 2

## 2024-03-24 MED ORDER — MIDAZOLAM HCL 2 MG/2ML IJ SOLN
INTRAMUSCULAR | Status: DC | PRN
  Administered 2024-03-24: 2 mg via INTRAVENOUS

## 2024-03-24 MED ORDER — DEXAMETHASONE SODIUM PHOSPHATE 10 MG/ML IJ SOLN
INTRAMUSCULAR | Status: AC
  Filled 2024-03-24: qty 1

## 2024-03-24 SURGICAL SUPPLY — 30 items
ADAPTER IRRIG TUBE 2 SPIKE SOL (ADAPTER) ×2 IMPLANT
BAG URO DRAIN 4000ML (MISCELLANEOUS) ×1 IMPLANT
CATH URETL OPEN END 4X70 (CATHETERS) ×1 IMPLANT
CATH URTH STD 24FR FL 3W 2 (CATHETERS) ×1 IMPLANT
CONTAINER COLLECT MORCELLATR (MISCELLANEOUS) ×1 IMPLANT
DRAPE UTILITY 15X26 TOWEL STRL (DRAPES) IMPLANT
ELECT BIVAP BIPO 22/24 DONUT (ELECTROSURGICAL) ×1 IMPLANT
ELECTRD BIVAP BIPO 22/24 DONUT (ELECTROSURGICAL) IMPLANT
FIBER LASER MOSES 550 DFL (Laser) ×1 IMPLANT
FILTER OVERFLOW MORCELLATOR (FILTER) ×1 IMPLANT
GLOVE BIOGEL PI IND STRL 7.5 (GLOVE) ×1 IMPLANT
GOWN STRL REUS W/ TWL LRG LVL3 (GOWN DISPOSABLE) ×1 IMPLANT
GOWN STRL REUS W/ TWL XL LVL3 (GOWN DISPOSABLE) ×1 IMPLANT
HOLDER FOLEY CATH W/STRAP (MISCELLANEOUS) ×1 IMPLANT
KIT TURNOVER CYSTO (KITS) ×1 IMPLANT
MBRN O SEALING YLW 17 FOR INST (MISCELLANEOUS) ×1 IMPLANT
MEMBRANE SLNG YLW 17 FOR INST (MISCELLANEOUS) ×1 IMPLANT
MORCELLATOR COLLECT CONTAINER (MISCELLANEOUS) ×1 IMPLANT
MORCELLATOR OVERFLOW FILTER (FILTER) ×1 IMPLANT
MORCELLATOR ROTATION 4.75 335 (MISCELLANEOUS) ×1 IMPLANT
PACK CYSTO AR (MISCELLANEOUS) ×1 IMPLANT
SET CYSTO W/LG BORE CLAMP LF (SET/KITS/TRAYS/PACK) ×1 IMPLANT
SET IRRIG Y TYPE TUR BLADDER L (SET/KITS/TRAYS/PACK) ×1 IMPLANT
SLEEVE PROTECTION STRL DISP (MISCELLANEOUS) ×2 IMPLANT
SOL .9 NS 3000ML IRR UROMATIC (IV SOLUTION) ×5 IMPLANT
SURGILUBE 2OZ TUBE FLIPTOP (MISCELLANEOUS) ×1 IMPLANT
SYR TOOMEY IRRIG 70ML (MISCELLANEOUS) ×1 IMPLANT
SYRINGE TOOMEY IRRIG 70ML (MISCELLANEOUS) ×1 IMPLANT
TUBE PUMP MORCELLATOR PIRANHA (TUBING) ×1 IMPLANT
WATER STERILE IRR 1000ML POUR (IV SOLUTION) ×1 IMPLANT

## 2024-03-24 NOTE — Anesthesia Procedure Notes (Signed)
 Procedure Name: Intubation Date/Time: 03/24/2024 9:15 AM  Performed by: Rich Brave, CRNAPre-anesthesia Checklist: Patient identified, Emergency Drugs available, Suction available, Patient being monitored and Timeout performed Patient Re-evaluated:Patient Re-evaluated prior to induction Oxygen Delivery Method: Circle system utilized Preoxygenation: Pre-oxygenation with 100% oxygen Induction Type: IV induction Ventilation: Mask ventilation without difficulty Laryngoscope Size: McGrath and 4 Grade View: Grade I Tube type: Oral Tube size: 7.0 mm Number of attempts: 1 Airway Equipment and Method: Stylet and Video-laryngoscopy Placement Confirmation: ETT inserted through vocal cords under direct vision, positive ETCO2 and breath sounds checked- equal and bilateral Secured at: 23 cm Tube secured with: Tape Dental Injury: Teeth and Oropharynx as per pre-operative assessment

## 2024-03-24 NOTE — Anesthesia Preprocedure Evaluation (Signed)
 Anesthesia Evaluation  Patient identified by MRN, date of birth, ID band Patient awake    Reviewed: Allergy & Precautions, H&P , NPO status , Patient's Chart, lab work & pertinent test results, reviewed documented beta blocker date and time   Airway Mallampati: II  TM Distance: >3 FB Neck ROM: full    Dental  (+) Teeth Intact   Pulmonary neg pulmonary ROS, former smoker   Pulmonary exam normal        Cardiovascular Exercise Tolerance: Good hypertension, On Medications negative cardio ROS Normal cardiovascular exam Rhythm:regular Rate:Normal     Neuro/Psych  Neuromuscular disease  negative psych ROS   GI/Hepatic Neg liver ROS,GERD  Medicated,,  Endo/Other  negative endocrine ROSdiabetes    Renal/GU negative Renal ROS  negative genitourinary   Musculoskeletal   Abdominal   Peds  Hematology  (+) Blood dyscrasia, anemia   Anesthesia Other Findings Past Medical History: No date: Anemia     Comment:  6 MONTHS AGO No date: Benign prostatic hyperplasia with urinary obstruction No date: BNC (bladder neck contracture) No date: BPH (benign prostatic hyperplasia) No date: Elevated PSA No date: GERD (gastroesophageal reflux disease) No date: Hyperlipidemia No date: Hypertension No date: Lower urinary tract symptoms (LUTS)     Comment:  3- 31YRS AGO No date: Neuromuscular disorder (HCC) No date: Plantar fasciitis of left foot     Comment:  BOTH FEET MOSTLY LEFT No date: Tendonitis, Achilles, left No date: Type 2 diabetes mellitus (HCC) No date: Wears glasses Past Surgical History: 1990's: BONE MARROW HARVEST     Comment:  donation to daughter 2007 approx AND ONE SINCE: COLONOSCOPY No date: CYSTOSCOPY     Comment:  IN OFFICE 07/08/2016: CYSTOSCOPY WITH BIOPSY; N/A     Comment:  Procedure: FLEXIBLE CYSTOSCOPY AND PROSTATE ULTRASOUND               WITH BIOPSY;  Surgeon: Barron Alvine, MD;  Location:                [facility];  Service: Urology;                Laterality: N/A;  bladder 12/20/2018: CYSTOSCOPY WITH INSERTION OF UROLIFT; N/A     Comment:  Procedure: CYSTOSCOPY WITH INSERTION OF UROLIFT;                Surgeon: Jerilee Field, MD;  Location: [facility];  Service: Urology;  Laterality: N/A; No date: PROSTATE BIOPSY as child: TONSILLECTOMY   Reproductive/Obstetrics negative OB ROS                             Anesthesia Physical Anesthesia Plan  ASA: 3  Anesthesia Plan: General ETT   Post-op Pain Management:    Induction:   PONV Risk Score and Plan:   Airway Management Planned:   Additional Equipment:   Intra-op Plan:   Post-operative Plan:   Informed Consent: I have reviewed the patients History and Physical, chart, labs and discussed the procedure including the risks, benefits and alternatives for the proposed anesthesia with the patient or authorized representative who has indicated his/her understanding and acceptance.     Dental Advisory Given  Plan Discussed with: CRNA  Anesthesia Plan Comments:        Anesthesia Quick Evaluation

## 2024-03-24 NOTE — Op Note (Addendum)
 Date of procedure: 03/24/24  Preoperative diagnosis:  BPH with obstruction  Postoperative diagnosis:  Same  Procedure: HoLEP (Holmium Laser Enucleation of the Prostate)  Surgeon: Legrand Rams, Timothy Shepherd  Anesthesia: General  Complications: None  Intraoperative findings:  Large prostate with obstructive lateral lobes, large floppy bladder with moderate trabeculations, no suspicious lesions Multiple UroLift clips extracted Uncomplicated HOLEP, excellent hemostasis, ureteral orifices and verumontanum intact at conclusion of case  EBL: Minimal  Specimens: Prostate chips  Enucleation time: 22 minutes  Morcellation time: 14 minutes  Intra-op weight: 75g  Drains: 24 French three-way, 60 cc in balloon  Indication: Timothy Course, Timothy Shepherd is a 68 y.o. patient with obstructive urinary symptoms and incomplete emptying with PVRs >544ml, failed UroLift by outside urologist as well as medical management and opted for HOLEP.  After reviewing the management options for treatment, they elected to proceed with the above surgical procedure(s). We have discussed the potential benefits and risks of the procedure, side effects of the proposed treatment, the likelihood of the patient achieving the goals of the procedure, and any potential problems that might occur during the procedure or recuperation.  We specifically discussed the risks of bleeding, infection, hematuria and clot retention, need for additional procedures, possible overnight hospital stay, temporary urgency and incontinence, rare long-term incontinence, and retrograde ejaculation.  Informed consent has been obtained.   Description of procedure:  The patient was taken to the operating room and general anesthesia was induced.  The patient was placed in the dorsal lithotomy position, prepped and draped in the usual sterile fashion, and preoperative antibiotics(Ancef) were administered.  SCDs were placed for DVT prophylaxis.  A preoperative time-out  was performed.   Timothy Shepherd sounds were used to gently dilated the urethra up to 84F. The 54 French continuous flow resectoscope was inserted into the urethra using the visual obturator  The prostate was large with obstructive lateral lobes. The bladder was large and floppy, was thoroughly inspected and moderate trabeculations but no suspicious lesions.  The ureteral orifices were located in orthotopic position.    The laser was set to 2 J and 60 Hz and early apical release was performed by making a circumferential mucosal incision proximal to the sphincter.  A lambda incision was then made proximal to the verumontanum.  The prostate was enucleated en bloc circumferentially into the bladder.  Multiple UroLift clips were extracted.  The capsule was examined and laser was used for meticulous hemostasis.  Thorough examination of the bladder and fossa showed no other evidence of UroLift clips.  The 64 French resectoscope was then switched out for the 26 French nephroscope and prostate tissue was morcellated(Piranha) and the tissue sent to pathology.  A 24 French three-way catheter was inserted easily with the aid of a catheter guide, and 60 cc were placed in the balloon.  Urine was clear.  The catheter irrigated easily with a Toomey syringe.  CBI was initiated.   The patient tolerated the procedure well without any immediate complications and was extubated and transferred to the recovery room in stable condition.  Urine was clear on fast CBI.  Disposition: Stable to PACU  Plan: Wean CBI in PACU, anticipate discharge home today with Foley removal in clinic in 2-3 days May have chronically elevated PVRs for at least a few months  Legrand Rams, Timothy Shepherd 03/24/2024

## 2024-03-24 NOTE — H&P (Signed)
 03/24/24 8:53 AM   Timothy Course, MD 04-16-1956 [id number]  CC: BPH with obstruction  HPI: 68 year old male with 100 g prostate on MRI, no suspicious lesions, history of negative prostate biopsy, incomplete bladder emptying with PVRs >560ml, history of UroLift in [place] who presents today for HOLEP.  Continues to have weak urinary stream and frequency.  Long history of recurrent urinary tract infections.   PMH: Past Medical History:  Diagnosis Date   Anemia    6 MONTHS AGO   Benign prostatic hyperplasia with urinary obstruction    BNC (bladder neck contracture)    BPH (benign prostatic hyperplasia)    Elevated PSA    GERD (gastroesophageal reflux disease)    Hyperlipidemia    Hypertension    Lower urinary tract symptoms (LUTS)    3- 74YRS AGO   Neuromuscular disorder (HCC)    Plantar fasciitis of left foot    BOTH FEET MOSTLY LEFT   Tendonitis, Achilles, left    Type 2 diabetes mellitus (HCC)    Wears glasses     Surgical History: Past Surgical History:  Procedure Laterality Date   BONE MARROW HARVEST  1990's   donation to daughter   COLONOSCOPY  2007 approx AND ONE SINCE   CYSTOSCOPY     IN OFFICE   CYSTOSCOPY WITH BIOPSY N/A 07/08/2016   Procedure: FLEXIBLE CYSTOSCOPY AND PROSTATE ULTRASOUND WITH BIOPSY;  Surgeon: Barron Alvine, MD;  Location: [facility];  Service: Urology;  Laterality: N/A;  bladder   CYSTOSCOPY WITH INSERTION OF UROLIFT N/A 12/20/2018   Procedure: CYSTOSCOPY WITH INSERTION OF UROLIFT;  Surgeon: Jerilee Field, MD;  Location[facility];  Service: Urology;  Laterality: N/A;   PROSTATE BIOPSY     TONSILLECTOMY  as child    Family History: History reviewed. No pertinent family history.  Social History:  reports that he quit smoking about 30 years ago. His smoking use included cigarettes. He started smoking about 50 years ago. He has a 15 pack-year smoking history. He has never used smokeless tobacco. He  reports current alcohol use. He reports that he does not use drugs.  Physical Exam: BP (!) 141/95   Pulse 82   Temp (!) 97.2 F (36.2 C) (Temporal)   Resp 16   SpO2 100%    Constitutional:  Alert and oriented, No acute distress. Cardiovascular: Regular rate and rhythm Respiratory: Clear to auscultation bilaterally GI: Abdomen is soft, nontender, nondistended, no abdominal masses   Laboratory Data: Urine culture 3/28 no growth  Assessment & Plan:   68 year old male with obstructive urinary symptoms, history of elevated PSA, negative biopsy, negative prostate MRI, prostate measured 100 g on MRI, incomplete bladder emptying, recurrent UTI who opted for HOLEP.  Also has a history of UroLift by outside urologist.  We discussed the risks and benefits of HoLEP at length.  The procedure requires general anesthesia and takes 1 to 2 hours, and a holmium laser is used to enucleate the prostate and push this tissue into the bladder.  A morcellator is then used to remove this tissue, which is sent for pathology.  The vast majority(>95%) of patients are able to discharge the same day with a catheter in place for 2 to 3 days, and will follow-up in clinic for a voiding trial.  We specifically discussed the risks of bleeding, infection, retrograde ejaculation, temporary urgency and urge incontinence, very low risk of long-term incontinence, urethral stricture/bladder neck contracture, pathologic evaluation of prostate tissue and possible detection  of prostate cancer or other malignancy, and possible need for additional procedures.  HOLEP today   Legrand Rams, MD 03/24/2024  [facility] Urology [address] [place], [place] [address] [phone]

## 2024-03-24 NOTE — Transfer of Care (Signed)
 Immediate Anesthesia Transfer of Care Note  Patient: Timothy Course, MD  Procedure(s) Performed: ENUCLEATION, PROSTATE, USING LASER, WITH MORCELLATION (Prostate)  Patient Location: PACU  Anesthesia Type:General  Level of Consciousness: drowsy and patient cooperative  Airway & Oxygen Therapy: Patient Spontanous Breathing and Patient connected to face mask oxygen  Post-op Assessment: Report given to RN and Post -op Vital signs reviewed and stable  Post vital signs: Reviewed and stable  Last Vitals:  Vitals Value Taken Time  BP 135/73 03/24/24 1024  Temp 97   Pulse 80 03/24/24 1027  Resp 16   SpO2 100 % 03/24/24 1027  Vitals shown include unfiled device data.  Last Pain:  Vitals:   03/24/24 0742  TempSrc: Temporal  PainSc: 0-No pain         Complications: No notable events documented.

## 2024-03-27 ENCOUNTER — Encounter: Payer: Self-pay | Admitting: Urology

## 2024-03-27 ENCOUNTER — Ambulatory Visit (INDEPENDENT_AMBULATORY_CARE_PROVIDER_SITE_OTHER): Admitting: Physician Assistant

## 2024-03-27 VITALS — BP 110/71 | HR 103

## 2024-03-27 DIAGNOSIS — N401 Enlarged prostate with lower urinary tract symptoms: Secondary | ICD-10-CM

## 2024-03-27 DIAGNOSIS — N138 Other obstructive and reflux uropathy: Secondary | ICD-10-CM

## 2024-03-27 LAB — SURGICAL PATHOLOGY

## 2024-03-27 NOTE — Progress Notes (Signed)
 Catheter Removal  Patient is present today for a catheter removal.  58ml of water was drained from the balloon. A 24FR three-way foley cath was removed from the bladder, no complications were noted. Patient tolerated well.  Performed by: Anchor Dwan, PA-C   Additional notes: Counseled patient on normal postoperative findings including dysuria, gross hematuria, and urinary urgency/leakage. Counseled patient to begin Kegel exercises 3x10 sets daily to increase urinary control and wear absorbent products as needed for security. Written and verbal resources provided today. Surgical pathology pending; will defer to Dr. Estanislao Heimlich to share results when available.   Follow up/ Additional notes: Return in about 4 months (around 07/27/2024) for Postop f/u with Dr. Estanislao Heimlich.

## 2024-03-27 NOTE — Patient Instructions (Signed)

## 2024-03-28 NOTE — Anesthesia Postprocedure Evaluation (Signed)
 Anesthesia Post Note  Patient: Timothy Goldman, MD  Procedure(s) Performed: ENUCLEATION, PROSTATE, USING LASER, WITH MORCELLATION (Prostate)  Patient location during evaluation: PACU Anesthesia Type: General Level of consciousness: awake and alert Pain management: pain level controlled Vital Signs Assessment: post-procedure vital signs reviewed and stable Respiratory status: spontaneous breathing, nonlabored ventilation, respiratory function stable and patient connected to nasal cannula oxygen Cardiovascular status: stable and blood pressure returned to baseline Postop Assessment: no apparent nausea or vomiting Anesthetic complications: no   No notable events documented.   Last Vitals:  Vitals:   03/24/24 1133 03/24/24 1154  BP:  (!) 127/97  Pulse: 70   Resp: 18 18  Temp: (!) 36.1 C (!) 36.2 C  SpO2: 99% 98%    Last Pain:  Vitals:   03/26/24 1217  TempSrc:   PainSc: 2                  Zula Hitch

## 2024-04-13 IMAGING — MR MR PROSTATE WO/W CM
12 series · 48 of 48 positions shown · IV contrast (8ML GADAVIST)
Comparison: 09/01/2018

CLINICAL DATA: Elevated PSA level. R97.20. Benign biopsy
07/08/2016.

EXAM:
MR PROSTATE WITHOUT AND WITH CONTRAST
TECHNIQUE: Multiplanar multisequence MRI images were obtained of the pelvis
centered about the prostate. Pre and post contrast images were
obtained.
CONTRAST:  8mL GADAVIST GADOBUTROL 1 MMOL/ML IV SOLN

[Series 3: T1 · axial · 5.0mm · 1.19mm/px · 1 of 72 slices shown (1 of 2)]
[im 1/72]
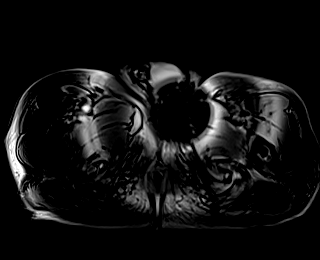

[Series 4: T1 · axial · 5.0mm · 1.19mm/px · z∈[-70,+285]mm · 2 of 72 slices shown (2 of 2)]
[im 1/72]
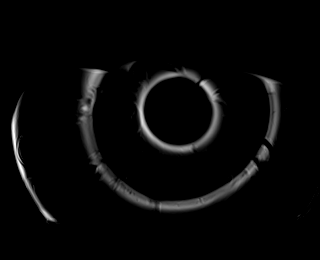
[im 72/72]
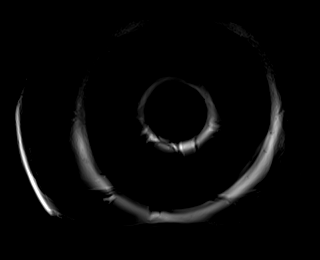

[Series 6: T2 · axial · 3.0mm · 0.47mm/px · 1 of 33 slices shown (1 of 3)]
[im 1/33]
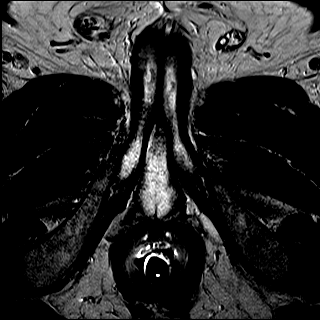

[Series 7: T2 · coronal · 3.0mm · 0.47mm/px · 1 of 30 slices shown (2 of 3)]
[im 1/30]
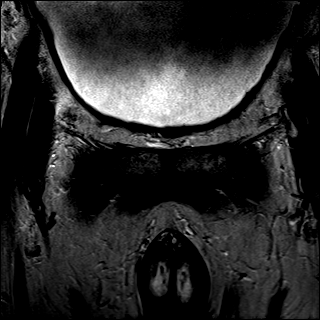

[Series 8: T2 · axial · 1.0mm · 1.00mm/px · z∈[-33,+54]mm · 2 of 88 slices shown (3 of 3)]
[im 1/88]
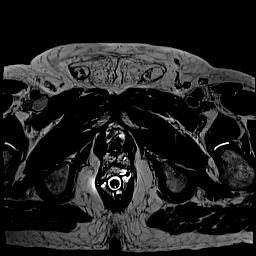
[im 88/88]
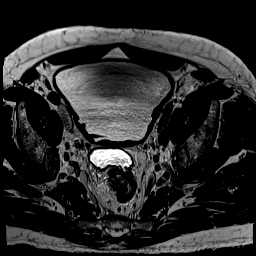

[Series 9: ep2d_diff_b100_500_800_tra_endo**_tracew_dfc_mix · axial · 3.0mm · 1.60mm/px · z∈[-42,+51]mm · 2 of 96 slices shown]
[im 1/96]
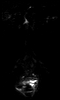
[im 96/96]
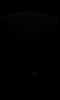

[Series 10: ep2d_diff_b100_500_800_tra_endo**_adc_dfc_mix · axial · 3.0mm · 1.60mm/px · 1 of 32 slices shown]
[im 1/32]
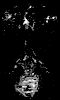

[Series 11: ep2d_diff_b100_500_800_tra_endo**_calc_bval_dfc_mix · axial · 3.0mm · 1.60mm/px · 1 of 31 slices shown]
[im 1/31]
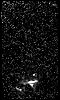

[Series 12: ep2d_diff_bvalue (id) · axial · 3.0mm · 1.60mm/px · 1 of 33 slices shown]
[im 1/33]
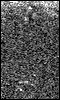

[Series 13: axial multiphase · axial · 3.0mm · 0.98mm/px · z∈[-56,+49]mm · 17 of 720 slices shown]
[im 1/720]
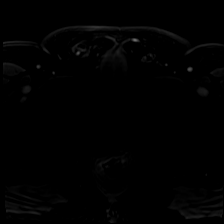
[im 45/720]
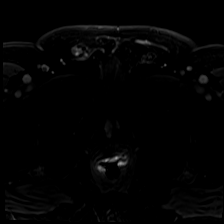
[im 90/720]
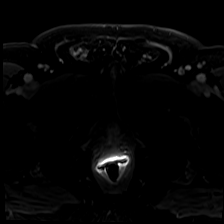
[im 135/720]
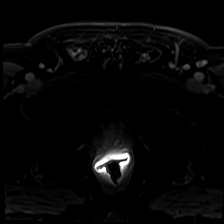
[im 180/720]
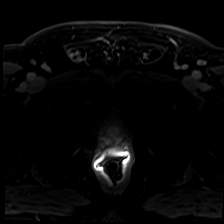
[im 225/720]
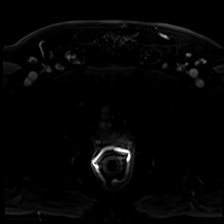
[im 270/720]
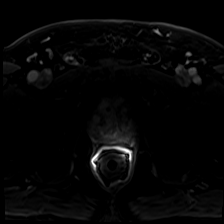
[im 315/720]
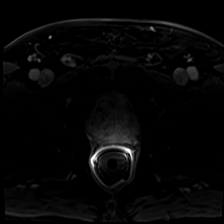
[im 360/720]
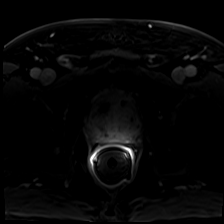
[im 405/720]
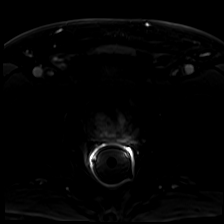
[im 450/720]
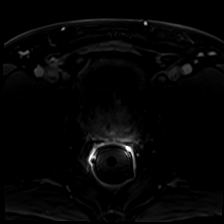
[im 495/720]
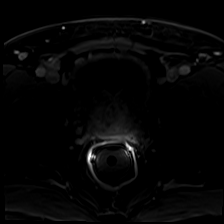
[im 540/720]
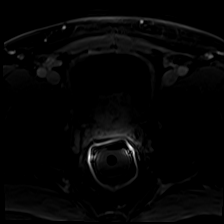
[im 585/720]
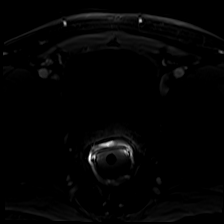
[im 630/720]
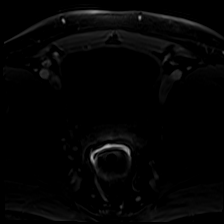
[im 675/720]
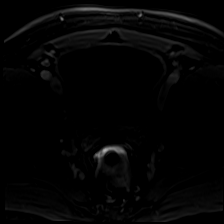
[im 720/720]
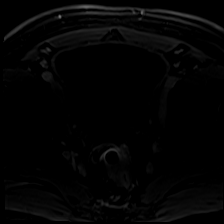

[Series 14: axial multiphase_sub · axial · 3.0mm · 0.98mm/px · z∈[-56,+49]mm · 17 of 684 slices shown]
[im 1/684]
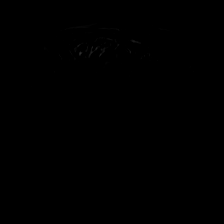
[im 43/684]
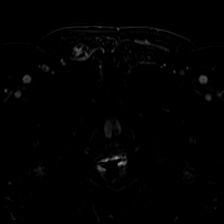
[im 86/684]
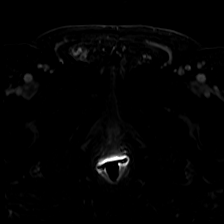
[im 129/684]
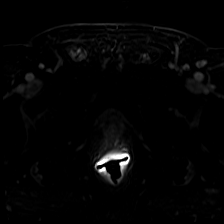
[im 171/684]
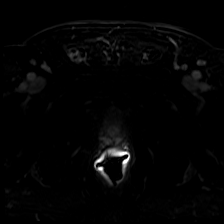
[im 214/684]
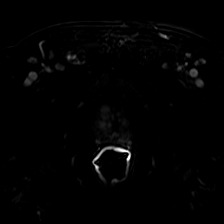
[im 257/684]
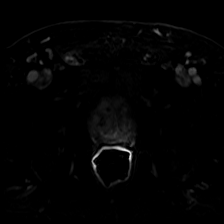
[im 299/684]
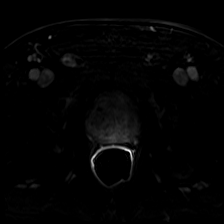
[im 342/684]
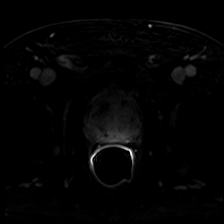
[im 385/684]
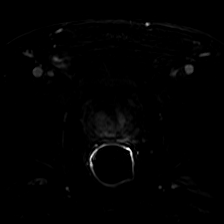
[im 427/684]
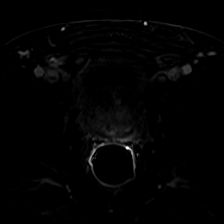
[im 470/684]
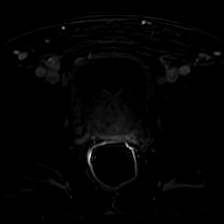
[im 513/684]
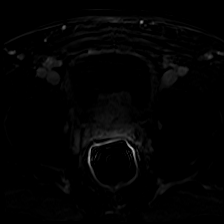
[im 555/684]
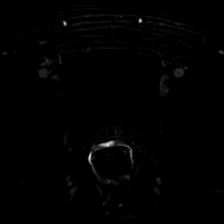
[im 598/684]
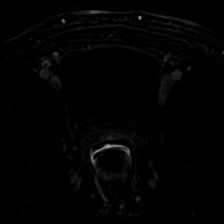
[im 641/684]
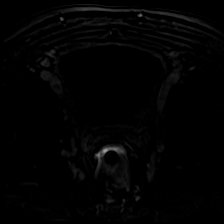
[im 684/684]
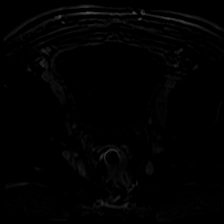

[Series 16: iliac crest thru · axial · 2.5mm · 1.19mm/px · z∈[-99,+139]mm · 2 of 96 slices shown]
[im 1/96]
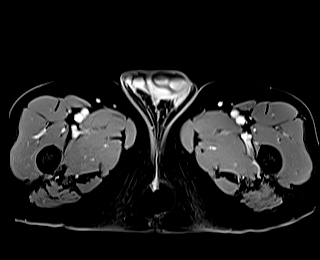
[im 96/96]
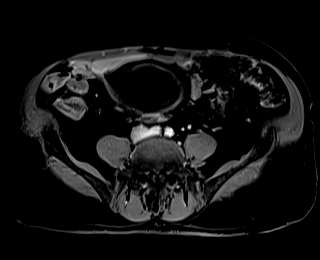

[48 of 48 positions shown; findings below may reference images not displayed]

FINDINGS: Prostate: Metal artifact from UroLift. Encapsulated nodularity in
the transition zone compatible with benign prostatic hypertrophy.
Diffuse hazy low T2 signal in the peripheral zone, likely
postinflammatory, an without a focal peripheral zone lesion
identified.

No findings of intermediate or higher suspicion for prostate cancer
are apparent on today's MRI examination.

Volume: 3D volumetric analysis: Prostate volume 98.92 cc (5.9 by
by 6.1 cm).

Transcapsular spread:  Absent

Seminal vesicle involvement: Absent

Neurovascular bundle involvement: Absent

Pelvic adenopathy: Absent

Bone metastasis: Absent

Other findings: No supplemental non-categorized findings.
IMPRESSION: 1. No findings of intermediate or higher suspicion for prostate
cancer identified.
2. Prostatomegaly and benign prostatic hypertrophy.
3. UroLift noted.

## 2024-06-29 ENCOUNTER — Encounter: Payer: Self-pay | Admitting: Urology

## 2024-07-27 ENCOUNTER — Ambulatory Visit: Admitting: Urology

## 2024-08-09 ENCOUNTER — Ambulatory Visit (INDEPENDENT_AMBULATORY_CARE_PROVIDER_SITE_OTHER): Admitting: Urology

## 2024-08-09 VITALS — BP 145/79 | HR 81 | Ht 70.0 in | Wt 178.0 lb

## 2024-08-09 DIAGNOSIS — N401 Enlarged prostate with lower urinary tract symptoms: Secondary | ICD-10-CM | POA: Diagnosis not present

## 2024-08-09 DIAGNOSIS — N138 Other obstructive and reflux uropathy: Secondary | ICD-10-CM

## 2024-08-09 DIAGNOSIS — N529 Male erectile dysfunction, unspecified: Secondary | ICD-10-CM

## 2024-08-09 LAB — BLADDER SCAN AMB NON-IMAGING: Scan Result: 14

## 2024-08-09 MED ORDER — TADALAFIL 10 MG PO TABS
10.0000 mg | ORAL_TABLET | Freq: Every day | ORAL | 3 refills | Status: AC
Start: 2024-08-09 — End: ?

## 2024-08-09 NOTE — Progress Notes (Signed)
   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  He has been doing very well since surgery.  Urinating with a strong stream, no incontinence.  PVR normal today at 11ml.  He has some concerns about ED.  Previously was using both Cialis  daily and sildenafil prior to surgery.  He gets moderate results with a daily Cialis  dose, but sildenafil has been less effective.  Reportedly was on testosterone previously in the past over 5 years ago, he was interested in rechecking a testosterone level which I think is very reasonable.  Having retrograde ejaculation as expected post HOLEP.  I recommended trying the Cialis  10 mg daily with a 10 mg boost dose as needed.  He has follow-up in the next few weeks with Dr. Nieves, and can continue care with him for ED.  Cialis  increased to 10 mg daily with 10 mg boost dose as needed Follow-up with [facility] as needed, will continue to follow with Dr. Cherylin Eagles at [facility] in [place] for ED  Redell JAYSON Burnet, MD  [facility] [address] [place], [place] [address] [phone]
